# Patient Record
Sex: Female | Born: 1940 | Race: White | Hispanic: No | Smoking: Never smoker
Health system: Southern US, Community
[De-identification: ages and names within clinical notes are randomized; demographics above are authoritative.]

## PROBLEM LIST (undated history)

## (undated) DIAGNOSIS — G4730 Sleep apnea, unspecified: Secondary | ICD-10-CM

## (undated) DIAGNOSIS — E232 Diabetes insipidus: Secondary | ICD-10-CM

## (undated) DIAGNOSIS — I639 Cerebral infarction, unspecified: Secondary | ICD-10-CM

## (undated) DIAGNOSIS — J449 Chronic obstructive pulmonary disease, unspecified: Secondary | ICD-10-CM

## (undated) DIAGNOSIS — C50919 Malignant neoplasm of unspecified site of unspecified female breast: Secondary | ICD-10-CM

## (undated) DIAGNOSIS — E119 Type 2 diabetes mellitus without complications: Secondary | ICD-10-CM

## (undated) DIAGNOSIS — E7800 Pure hypercholesterolemia, unspecified: Secondary | ICD-10-CM

## (undated) DIAGNOSIS — J45909 Unspecified asthma, uncomplicated: Secondary | ICD-10-CM

## (undated) DIAGNOSIS — R011 Cardiac murmur, unspecified: Secondary | ICD-10-CM

## (undated) DIAGNOSIS — I10 Essential (primary) hypertension: Secondary | ICD-10-CM

## (undated) DIAGNOSIS — N289 Disorder of kidney and ureter, unspecified: Secondary | ICD-10-CM

## (undated) DIAGNOSIS — I219 Acute myocardial infarction, unspecified: Secondary | ICD-10-CM

## (undated) DIAGNOSIS — I509 Heart failure, unspecified: Secondary | ICD-10-CM

## (undated) DIAGNOSIS — K219 Gastro-esophageal reflux disease without esophagitis: Secondary | ICD-10-CM

## (undated) DIAGNOSIS — E041 Nontoxic single thyroid nodule: Secondary | ICD-10-CM

## (undated) HISTORY — PX: CAROTID STENT: SHX1301

## (undated) HISTORY — DX: Cerebral infarction, unspecified: I63.9

## (undated) HISTORY — DX: Gastro-esophageal reflux disease without esophagitis: K21.9

## (undated) HISTORY — PX: CHOLECYSTECTOMY: SHX55

## (undated) HISTORY — DX: Cardiac murmur, unspecified: R01.1

## (undated) HISTORY — DX: Sleep apnea, unspecified: G47.30

## (undated) HISTORY — DX: Chronic obstructive pulmonary disease, unspecified: J44.9

## (undated) HISTORY — DX: Pure hypercholesterolemia, unspecified: E78.00

## (undated) HISTORY — PX: TONSILLECTOMY AND ADENOIDECTOMY: SHX28

## (undated) HISTORY — DX: Acute myocardial infarction, unspecified: I21.9

## (undated) HISTORY — DX: Unspecified asthma, uncomplicated: J45.909

## (undated) HISTORY — DX: Disorder of kidney and ureter, unspecified: N28.9

## (undated) HISTORY — DX: Nontoxic single thyroid nodule: E04.1

## (undated) HISTORY — DX: Diabetes insipidus: E23.2

## (undated) HISTORY — DX: Essential (primary) hypertension: I10

## (undated) HISTORY — PX: TOTAL ABDOMINAL HYSTERECTOMY: SHX209

## (undated) HISTORY — DX: Heart failure, unspecified: I50.9

## (undated) HISTORY — DX: Malignant neoplasm of unspecified site of unspecified female breast: C50.919

## (undated) HISTORY — DX: Type 2 diabetes mellitus without complications: E11.9

## (undated) HISTORY — PX: APPENDECTOMY: SHX54

---

## 1996-05-07 HISTORY — PX: CORONARY ANGIOPLASTY: SHX604

## 1996-05-07 HISTORY — PX: NISSEN FUNDOPLICATION: SHX2091

## 1998-05-07 DIAGNOSIS — C50919 Malignant neoplasm of unspecified site of unspecified female breast: Secondary | ICD-10-CM

## 1998-05-07 HISTORY — DX: Malignant neoplasm of unspecified site of unspecified female breast: C50.919

## 1999-05-08 HISTORY — PX: MASTECTOMY: SHX3

## 1999-05-08 HISTORY — PX: BREAST SURGERY: SHX581

## 2000-05-07 HISTORY — PX: PARATHYROIDECTOMY: SHX19

## 2004-04-26 ENCOUNTER — Ambulatory Visit: Payer: Self-pay | Admitting: Oncology

## 2004-12-21 ENCOUNTER — Ambulatory Visit: Payer: Self-pay | Admitting: Oncology

## 2005-01-01 ENCOUNTER — Ambulatory Visit: Payer: Self-pay | Admitting: General Surgery

## 2005-05-31 ENCOUNTER — Ambulatory Visit: Payer: Self-pay | Admitting: Oncology

## 2005-11-05 ENCOUNTER — Ambulatory Visit: Payer: Self-pay | Admitting: Internal Medicine

## 2006-01-08 ENCOUNTER — Ambulatory Visit: Payer: Self-pay | Admitting: Cardiology

## 2006-01-09 ENCOUNTER — Ambulatory Visit: Payer: Self-pay | Admitting: General Surgery

## 2006-02-05 ENCOUNTER — Ambulatory Visit: Payer: Self-pay | Admitting: Ophthalmology

## 2006-02-11 ENCOUNTER — Other Ambulatory Visit: Payer: Self-pay

## 2006-02-11 ENCOUNTER — Emergency Department: Payer: Self-pay | Admitting: Emergency Medicine

## 2006-03-21 ENCOUNTER — Ambulatory Visit: Payer: Self-pay | Admitting: Oncology

## 2006-04-06 ENCOUNTER — Ambulatory Visit: Payer: Self-pay | Admitting: Oncology

## 2006-05-09 ENCOUNTER — Ambulatory Visit: Payer: Self-pay | Admitting: Ophthalmology

## 2006-05-13 ENCOUNTER — Ambulatory Visit: Payer: Self-pay | Admitting: Ophthalmology

## 2006-06-03 ENCOUNTER — Encounter: Payer: Self-pay | Admitting: Family Medicine

## 2006-06-07 ENCOUNTER — Encounter: Payer: Self-pay | Admitting: Family Medicine

## 2006-07-06 ENCOUNTER — Encounter: Payer: Self-pay | Admitting: Family Medicine

## 2006-08-19 ENCOUNTER — Ambulatory Visit: Payer: Self-pay | Admitting: Internal Medicine

## 2006-08-27 ENCOUNTER — Ambulatory Visit: Payer: Self-pay | Admitting: Internal Medicine

## 2006-10-21 ENCOUNTER — Ambulatory Visit: Payer: Self-pay | Admitting: Oncology

## 2006-11-01 ENCOUNTER — Ambulatory Visit: Payer: Self-pay | Admitting: Oncology

## 2006-11-04 ENCOUNTER — Ambulatory Visit: Payer: Self-pay | Admitting: Oncology

## 2006-11-05 ENCOUNTER — Ambulatory Visit: Payer: Self-pay | Admitting: Oncology

## 2006-12-22 IMAGING — CT CT HEAD WITHOUT CONTRAST
2 series · 16 of 30 positions shown, 20 images · non-contrast
Comparison: none

REASON FOR EXAM: facial numbness//Rm2
COMMENTS:

[Series 2: without · axial · non-contrast · 0.37mm/px · z∈[-94,+30]mm · 13 of 31 slices shown, 17 images]
[im 3/31  brain]
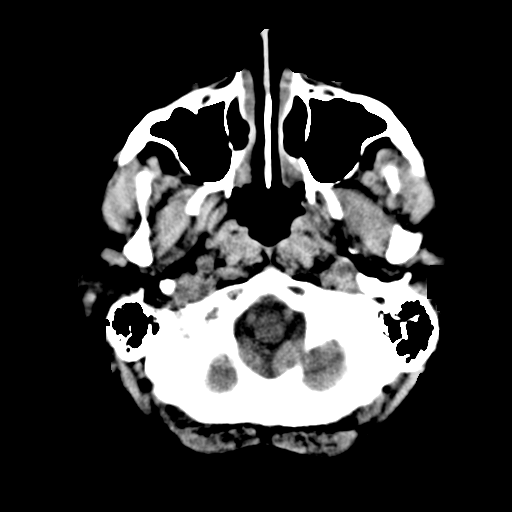
[im 3/31  bone]
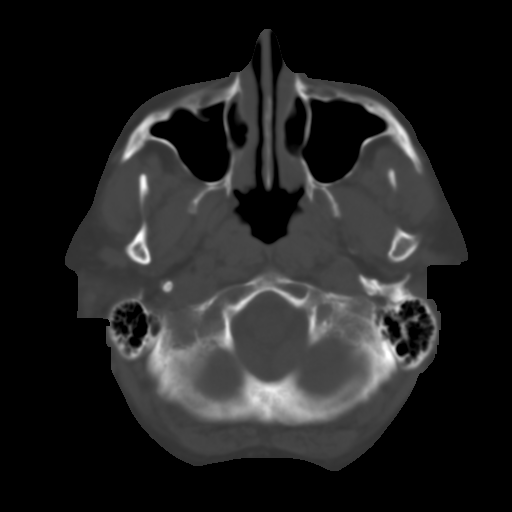
[im 5/31  brain]
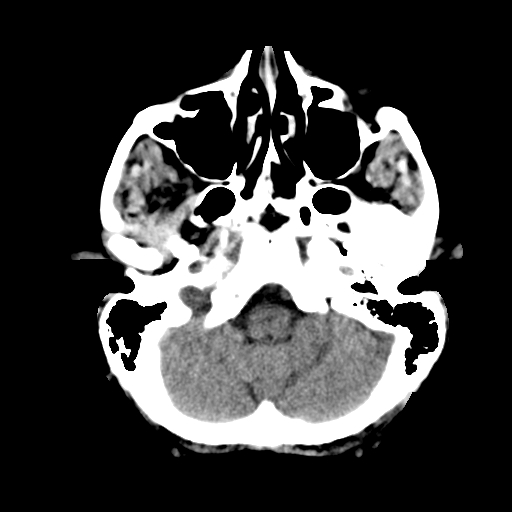
[im 7/31  brain]
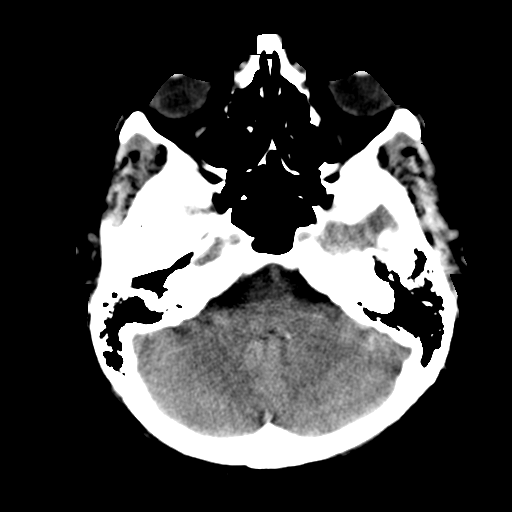
[im 9/31  brain]
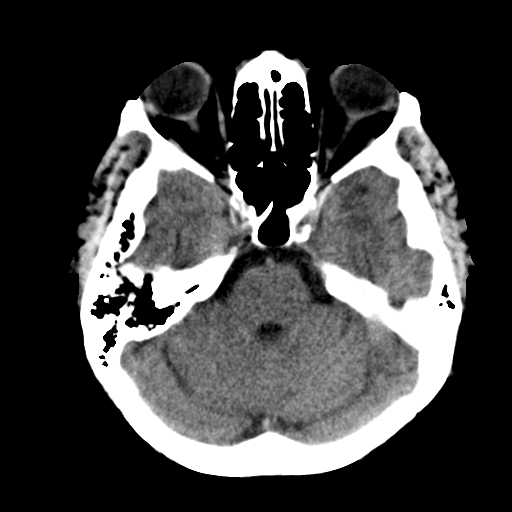
[im 11/31  brain]
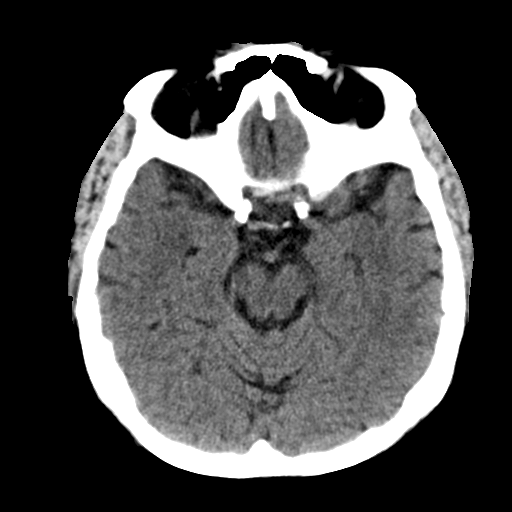
[im 11/31  bone]
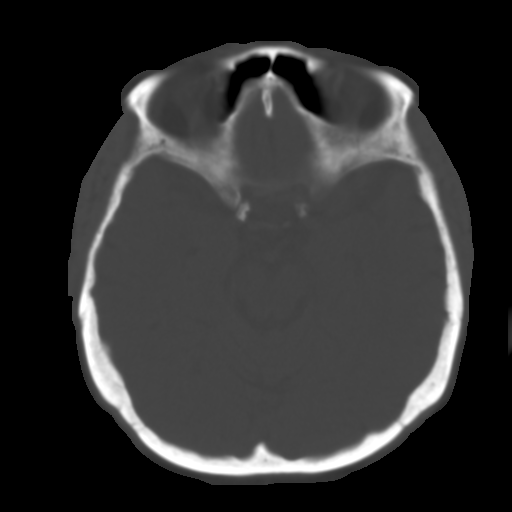
[im 13/31  brain]
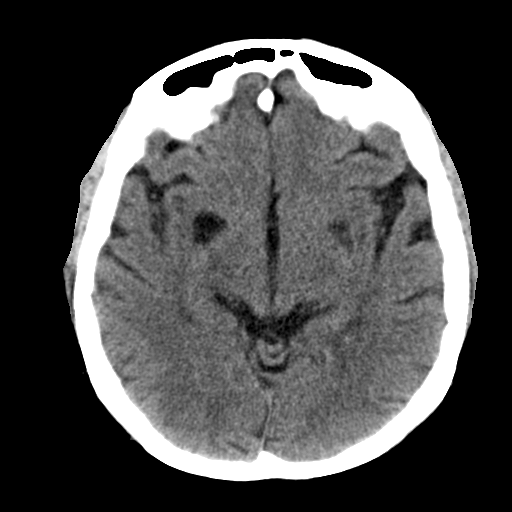
[im 16/31  brain]
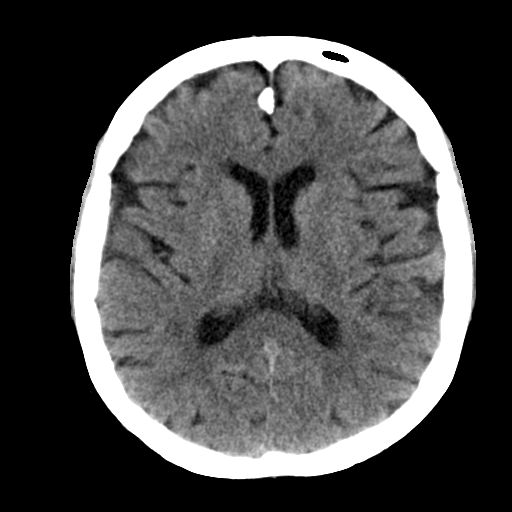
[im 18/31  brain]
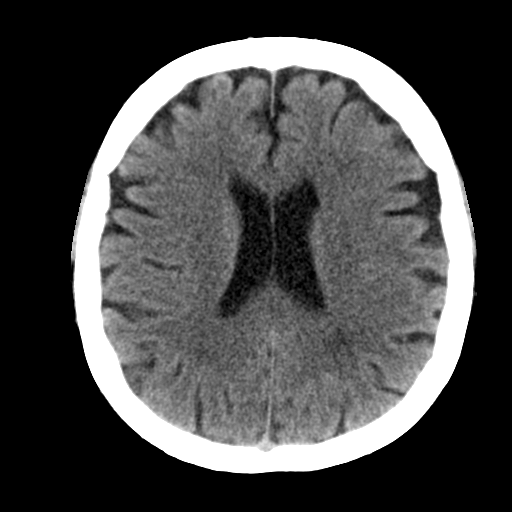
[im 20/31  brain]
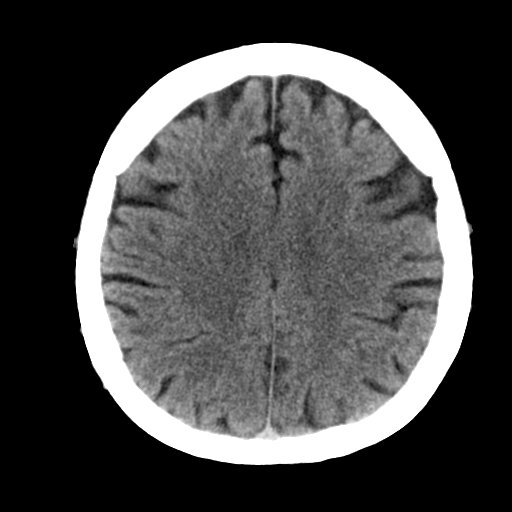
[im 20/31  bone]
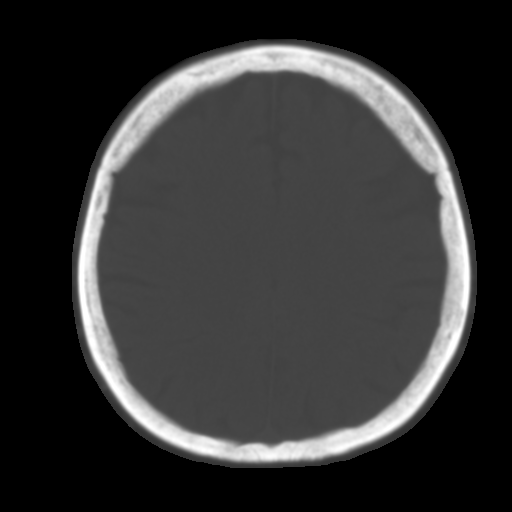
[im 22/31  brain]
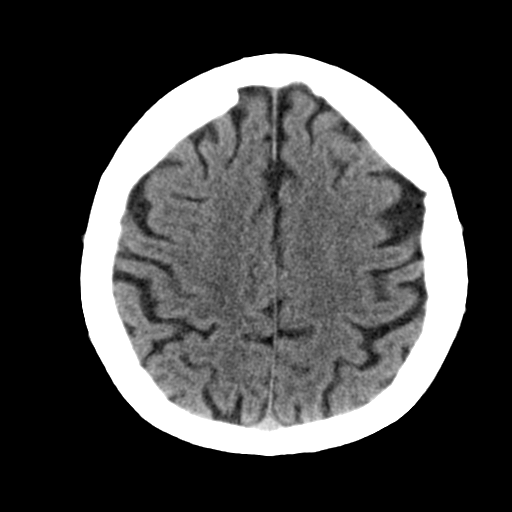
[im 24/31  brain]
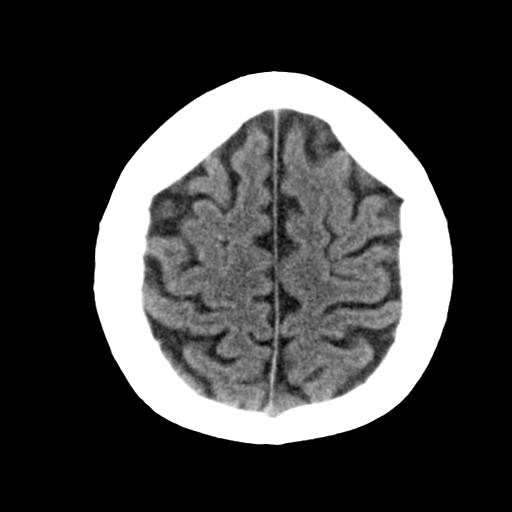
[im 26/31  brain]
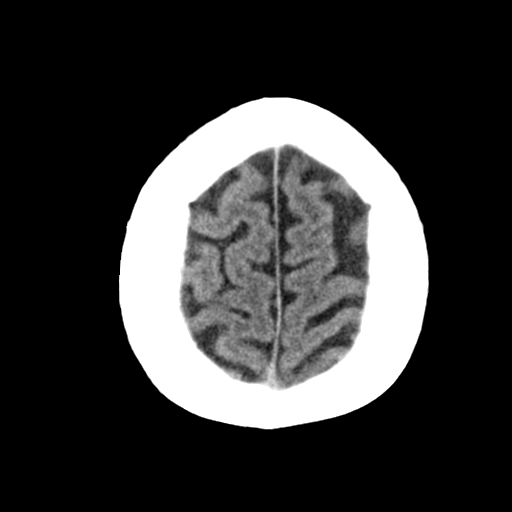
[im 28/31  brain]
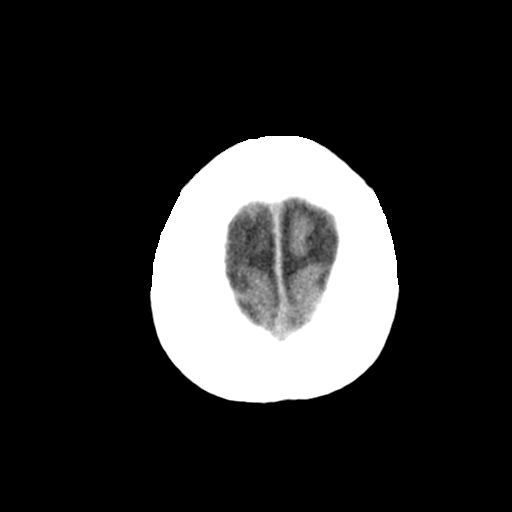
[im 28/31  bone]
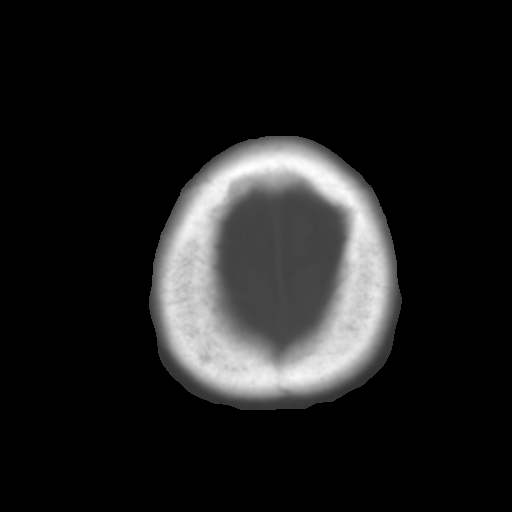

[Series 3: bone · axial · 0.37mm/px · z∈[-94,-54]mm · 3 of 31 slices shown]
[im 3/31  bone]
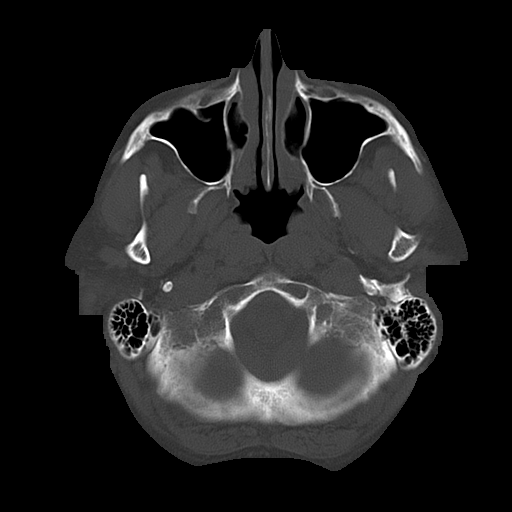
[im 7/31  bone]
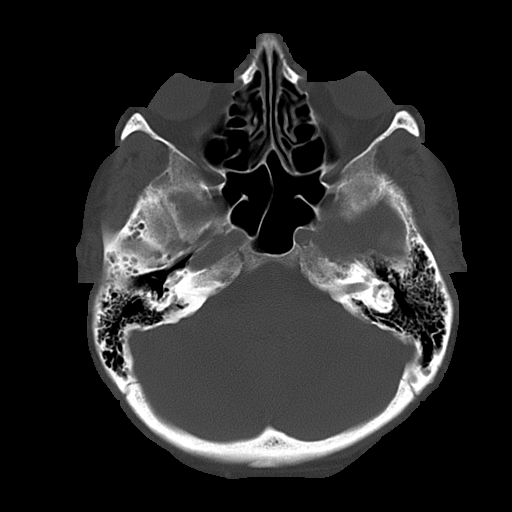
[im 11/31  bone]
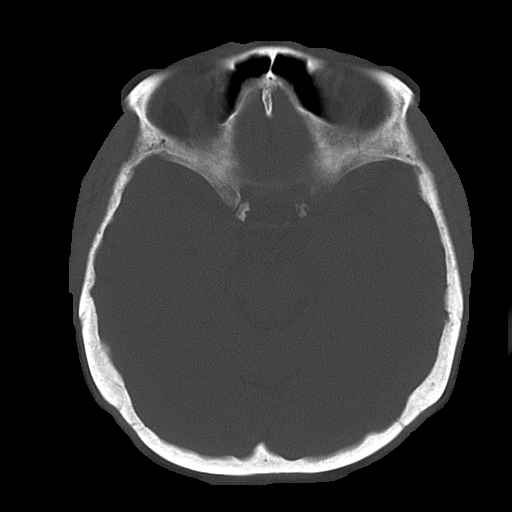

[16 of 30 positions shown; findings below may reference images not displayed]

PROCEDURE:     CT  - CT HEAD WITHOUT CONTRAST  - February 11, 2006  [DATE]

RESULT:     No intraaxial or extraaxial pathologic fluid or blood
collections identified.  No mass lesions are noted.  There is no
hydrocephalus.  Old lenticular nuclei lacunar infarctions are noted.
Paranasal sinuses are clear.
IMPRESSION: Bilateral lenticular nuclei lacunar infarctions noted.

No acute intracranial abnormalities identified.

Deep white matter changes consistent with chronic ischemia.

This report was phoned to the emergency room physician at the time of the
study.

## 2007-01-06 ENCOUNTER — Ambulatory Visit: Payer: Self-pay | Admitting: Oncology

## 2007-01-08 ENCOUNTER — Ambulatory Visit: Payer: Self-pay | Admitting: Oncology

## 2007-01-29 ENCOUNTER — Ambulatory Visit: Payer: Self-pay | Admitting: General Surgery

## 2007-02-05 ENCOUNTER — Ambulatory Visit: Payer: Self-pay | Admitting: Oncology

## 2007-04-07 ENCOUNTER — Ambulatory Visit: Payer: Self-pay | Admitting: Oncology

## 2007-04-11 ENCOUNTER — Ambulatory Visit: Payer: Self-pay | Admitting: Oncology

## 2007-05-08 ENCOUNTER — Ambulatory Visit: Payer: Self-pay | Admitting: Oncology

## 2007-06-03 ENCOUNTER — Ambulatory Visit: Payer: Self-pay | Admitting: Ophthalmology

## 2007-07-06 ENCOUNTER — Ambulatory Visit: Payer: Self-pay | Admitting: Oncology

## 2007-07-24 ENCOUNTER — Ambulatory Visit: Payer: Self-pay | Admitting: Oncology

## 2007-08-06 ENCOUNTER — Ambulatory Visit: Payer: Self-pay | Admitting: Oncology

## 2007-08-07 ENCOUNTER — Ambulatory Visit: Payer: Self-pay | Admitting: Oncology

## 2008-01-06 ENCOUNTER — Ambulatory Visit: Payer: Self-pay | Admitting: Oncology

## 2008-02-05 ENCOUNTER — Ambulatory Visit: Payer: Self-pay | Admitting: Oncology

## 2008-02-09 ENCOUNTER — Ambulatory Visit: Payer: Self-pay | Admitting: General Surgery

## 2008-03-07 ENCOUNTER — Ambulatory Visit: Payer: Self-pay | Admitting: Oncology

## 2008-03-11 ENCOUNTER — Ambulatory Visit: Payer: Self-pay | Admitting: Oncology

## 2008-04-06 ENCOUNTER — Ambulatory Visit: Payer: Self-pay | Admitting: Oncology

## 2009-01-05 ENCOUNTER — Ambulatory Visit: Payer: Self-pay | Admitting: Internal Medicine

## 2009-01-05 ENCOUNTER — Ambulatory Visit: Payer: Self-pay | Admitting: Oncology

## 2009-01-06 ENCOUNTER — Ambulatory Visit: Payer: Self-pay | Admitting: Oncology

## 2009-02-04 ENCOUNTER — Ambulatory Visit: Payer: Self-pay | Admitting: Oncology

## 2009-02-04 ENCOUNTER — Ambulatory Visit: Payer: Self-pay | Admitting: Internal Medicine

## 2009-03-30 ENCOUNTER — Ambulatory Visit: Payer: Self-pay | Admitting: General Surgery

## 2009-04-12 ENCOUNTER — Ambulatory Visit: Payer: Self-pay | Admitting: Ophthalmology

## 2009-04-18 ENCOUNTER — Ambulatory Visit: Payer: Self-pay | Admitting: Ophthalmology

## 2009-07-05 ENCOUNTER — Ambulatory Visit: Payer: Self-pay | Admitting: Oncology

## 2009-07-07 ENCOUNTER — Ambulatory Visit: Payer: Self-pay | Admitting: Oncology

## 2009-08-05 ENCOUNTER — Ambulatory Visit: Payer: Self-pay | Admitting: Oncology

## 2009-08-29 ENCOUNTER — Ambulatory Visit: Payer: Self-pay

## 2009-08-30 ENCOUNTER — Inpatient Hospital Stay: Payer: Self-pay | Admitting: Internal Medicine

## 2009-10-15 ENCOUNTER — Ambulatory Visit: Payer: Self-pay | Admitting: Internal Medicine

## 2009-12-14 ENCOUNTER — Ambulatory Visit: Payer: Self-pay | Admitting: Internal Medicine

## 2010-02-04 ENCOUNTER — Ambulatory Visit: Payer: Self-pay | Admitting: Oncology

## 2010-02-10 ENCOUNTER — Ambulatory Visit: Payer: Self-pay | Admitting: Oncology

## 2010-02-11 LAB — CANCER ANTIGEN 27.29: CA 27.29: 10.6 U/mL (ref 0.0–38.6)

## 2010-02-20 ENCOUNTER — Ambulatory Visit: Payer: Self-pay | Admitting: Specialist

## 2010-02-28 ENCOUNTER — Ambulatory Visit: Payer: Self-pay | Admitting: Specialist

## 2010-03-07 ENCOUNTER — Ambulatory Visit: Payer: Self-pay | Admitting: Oncology

## 2010-03-27 ENCOUNTER — Ambulatory Visit: Payer: Self-pay | Admitting: Unknown Physician Specialty

## 2010-03-29 LAB — PATHOLOGY REPORT

## 2010-04-11 ENCOUNTER — Ambulatory Visit: Payer: Self-pay | Admitting: Oncology

## 2010-05-13 ENCOUNTER — Emergency Department: Payer: Self-pay | Admitting: Emergency Medicine

## 2010-05-31 ENCOUNTER — Ambulatory Visit: Payer: Self-pay | Admitting: Oncology

## 2010-09-12 ENCOUNTER — Ambulatory Visit: Payer: Self-pay | Admitting: Oncology

## 2010-10-06 ENCOUNTER — Ambulatory Visit: Payer: Self-pay | Admitting: Oncology

## 2010-12-12 ENCOUNTER — Encounter: Payer: Self-pay | Admitting: Pulmonary Disease

## 2010-12-12 ENCOUNTER — Ambulatory Visit (INDEPENDENT_AMBULATORY_CARE_PROVIDER_SITE_OTHER): Payer: Medicare Other | Admitting: Pulmonary Disease

## 2010-12-12 VITALS — BP 140/70 | HR 74 | Temp 98.6°F | Ht 62.0 in | Wt 154.2 lb

## 2010-12-12 DIAGNOSIS — R05 Cough: Secondary | ICD-10-CM

## 2010-12-12 MED ORDER — CHLORPHENIRAMINE MALEATE 4 MG PO TABS: 4.0000 mg | ORAL_TABLET | Freq: Two times a day (BID) | ORAL | Status: AC

## 2010-12-12 MED ORDER — PSEUDOEPHEDRINE HCL ER 120 MG PO TB12: 120.0000 mg | ORAL_TABLET | Freq: Every day | ORAL | Status: AC

## 2010-12-12 NOTE — Progress Notes (Signed)
  Subjective:    Patient ID: Tanya Montgomery, female    DOB: 07/28/1940, 70 y.o.   MRN: [id number]  HPI 70/F , never smoker for evaluation of chronic cough. She reports Cough x 40y, productive She has been seen by Dr Meredeth Ide & Dr Williemae Area at [facility] - unchanged nodules Cough meds do not touch it - tussionex stops for some time Worse with laughter, morning is the  worst time Had reflux surgery > 10 y, takes 2 nexium daily, c/o breakthrough Reports sinus drainage No seasonal variations for cough, no wheezing or nocturnal symptoms Med review does not show any triggers.    Review of Systems  Constitutional: Positive for unexpected weight change. Negative for fever.  HENT: Positive for dental problem. Negative for ear pain, nosebleeds, congestion, sore throat, rhinorrhea, sneezing, trouble swallowing, postnasal drip and sinus pressure.   Eyes: Negative for redness and itching.  Respiratory: Positive for cough. Negative for chest tightness, shortness of breath and wheezing.   Cardiovascular: Negative for palpitations and leg swelling.  Gastrointestinal: Negative for nausea and vomiting.  Genitourinary: Negative for dysuria.  Musculoskeletal: Negative for joint swelling.  Skin: Negative for rash.  Neurological: Positive for headaches.  Hematological: Does not bruise/bleed easily.  Psychiatric/Behavioral: Negative for dysphoric mood. The patient is not nervous/anxious.        Objective:   Physical Exam Gen. Pleasant, well-nourished, in no distress, normal affect ENT - no lesions, no post nasal drip Neck: No JVD, no thyromegaly, no carotid bruits Lungs: no use of accessory muscles, no dullness to percussion, clear without rales or rhonchi  Cardiovascular: Rhythm regular, heart sounds  normal, no murmurs or gallops, no peripheral edema Abdomen: soft and non-tender, no hepatosplenomegaly, BS normal. Musculoskeletal: No deformities, no cyanosis or clubbing Neuro:  alert, non focal       Assessment & Plan:

## 2010-12-12 NOTE — Patient Instructions (Signed)
Trial of anti-histaminic (sleepiness) + decongestant (check BP 2-3 times/ wk) - OK to take these over the counter at pharmacy Obtain records from Dr Meredeth Ide [facility] & Dr Williemae Area [facility]) with CT scan results Call if flare

## 2010-12-15 DIAGNOSIS — R05 Cough: Secondary | ICD-10-CM | POA: Insufficient documentation

## 2010-12-15 DIAGNOSIS — R053 Chronic cough: Secondary | ICD-10-CM | POA: Insufficient documentation

## 2010-12-15 NOTE — Assessment & Plan Note (Signed)
Favor combination of upper airway cough & GERD Trial of anti-histaminic (sleepiness) + decongestant (check BP 2-3 times/ wk) - OK to take these over the counter at pharmacy Obtain records from Dr Meredeth Ide [facility] & Dr Williemae Area [facility]) with CT scan results, rather than re-inventing the wheel. Call if flare

## 2011-01-02 ENCOUNTER — Other Ambulatory Visit: Payer: Self-pay | Admitting: Unknown Physician Specialty

## 2011-03-28 ENCOUNTER — Ambulatory Visit: Payer: Self-pay | Admitting: Oncology

## 2011-03-30 LAB — CANCER ANTIGEN 27.29: CA 27.29: 15.4 U/mL (ref 0.0–38.6)

## 2011-04-07 ENCOUNTER — Ambulatory Visit: Payer: Self-pay | Admitting: Oncology

## 2011-05-08 ENCOUNTER — Ambulatory Visit: Payer: Self-pay | Admitting: Oncology

## 2011-06-08 ENCOUNTER — Ambulatory Visit: Payer: Self-pay | Admitting: Oncology

## 2011-08-24 ENCOUNTER — Inpatient Hospital Stay: Payer: Self-pay | Admitting: Internal Medicine

## 2011-08-24 LAB — COMPREHENSIVE METABOLIC PANEL
Albumin: 3.9 g/dL (ref 3.4–5.0)
Alkaline Phosphatase: 97 U/L (ref 50–136)
BUN: 20 mg/dL — ABNORMAL HIGH (ref 7–18)
Bilirubin,Total: 0.4 mg/dL (ref 0.2–1.0)
Calcium, Total: 9 mg/dL (ref 8.5–10.1)
Co2: 25 mmol/L (ref 21–32)
Creatinine: 0.98 mg/dL (ref 0.60–1.30)
EGFR (African American): >60
EGFR (Non-African Amer.): 58 — ABNORMAL LOW
Glucose: 103 mg/dL — ABNORMAL HIGH (ref 65–99)
Osmolality: 279 (ref 275–301)
Sodium: 138 mmol/L (ref 136–145)
Total Protein: 7.3 g/dL (ref 6.4–8.2)

## 2011-08-24 LAB — CBC
HGB: 13.4 g/dL (ref 12.0–16.0)
Platelet: 219 10*3/uL (ref 150–440)
RDW: 14.2 % (ref 11.5–14.5)
WBC: 7.6 10*3/uL (ref 3.6–11.0)

## 2011-08-24 LAB — CK TOTAL AND CKMB (NOT AT ARMC)
CK, Total: 96 U/L (ref 21–215)
CK-MB: 2.2 ng/mL (ref 0.5–3.6)

## 2011-08-24 LAB — PROTIME-INR: INR: 0.9

## 2011-08-24 LAB — TROPONIN I: Troponin-I: 0.03 ng/mL

## 2011-08-25 LAB — BASIC METABOLIC PANEL
Anion Gap: 10 (ref 7–16)
Calcium, Total: 8.3 mg/dL — ABNORMAL LOW (ref 8.5–10.1)
Chloride: 99 mmol/L (ref 98–107)
Co2: 29 mmol/L (ref 21–32)
Creatinine: 1.02 mg/dL (ref 0.60–1.30)
EGFR (Non-African Amer.): 55 — ABNORMAL LOW
Osmolality: 282 (ref 275–301)
Sodium: 138 mmol/L (ref 136–145)

## 2011-08-25 LAB — CBC WITH DIFFERENTIAL/PLATELET
Basophil %: 0.7 %
Eosinophil #: 0.1 10*3/uL (ref 0.0–0.7)
HCT: 34.7 % — ABNORMAL LOW (ref 35.0–47.0)
HGB: 11.8 g/dL — ABNORMAL LOW (ref 12.0–16.0)
Lymphocyte #: 2.7 10*3/uL (ref 1.0–3.6)
MCH: 32.1 pg (ref 26.0–34.0)
MCHC: 33.9 g/dL (ref 32.0–36.0)
MCV: 95 fL (ref 80–100)
Monocyte #: 0.6 x10 3/mm (ref 0.2–0.9)
Monocyte %: 8.1 %
Neutrophil #: 3.5 10*3/uL (ref 1.4–6.5)
Platelet: 199 10*3/uL (ref 150–440)
RBC: 3.66 10*6/uL — ABNORMAL LOW (ref 3.80–5.20)

## 2011-08-25 LAB — LIPID PANEL
Cholesterol: 184 mg/dL (ref 0–200)
HDL Cholesterol: 31 mg/dL — ABNORMAL LOW (ref 40–60)

## 2011-08-25 LAB — CK TOTAL AND CKMB (NOT AT ARMC)
CK, Total: 80 U/L (ref 21–215)
CK, Total: 87 U/L (ref 21–215)
CK-MB: 2.4 ng/mL (ref 0.5–3.6)

## 2011-08-25 LAB — TROPONIN I: Troponin-I: 0.03 ng/mL

## 2011-08-26 LAB — HEMATOCRIT: HCT: 36.5 % (ref 35.0–47.0)

## 2011-08-27 LAB — CBC WITH DIFFERENTIAL/PLATELET
Basophil #: 0 10*3/uL (ref 0.0–0.1)
Basophil %: 0.9 %
Eosinophil %: 1.5 %
HCT: 36.2 % (ref 35.0–47.0)
HGB: 12.3 g/dL (ref 12.0–16.0)
Lymphocyte #: 2.2 10*3/uL (ref 1.0–3.6)
MCHC: 33.9 g/dL (ref 32.0–36.0)
Monocyte #: 0.5 x10 3/mm (ref 0.2–0.9)
Monocyte %: 10.9 %
Neutrophil #: 1.8 10*3/uL (ref 1.4–6.5)
Neutrophil %: 39.2 %
Platelet: 161 10*3/uL (ref 150–440)
RBC: 3.82 10*6/uL (ref 3.80–5.20)
RDW: 14.1 % (ref 11.5–14.5)
WBC: 4.7 10*3/uL (ref 3.6–11.0)

## 2011-08-27 LAB — BASIC METABOLIC PANEL
BUN: 15 mg/dL (ref 7–18)
Calcium, Total: 8.5 mg/dL (ref 8.5–10.1)
Co2: 30 mmol/L (ref 21–32)
Creatinine: 0.74 mg/dL (ref 0.60–1.30)
EGFR (African American): >60
EGFR (Non-African Amer.): >60
Osmolality: 278 (ref 275–301)
Sodium: 138 mmol/L (ref 136–145)

## 2012-01-19 ENCOUNTER — Emergency Department: Payer: Self-pay | Admitting: Emergency Medicine

## 2012-01-19 LAB — COMPREHENSIVE METABOLIC PANEL
Albumin: 3.4 g/dL (ref 3.4–5.0)
Anion Gap: 10 (ref 7–16)
BUN: 14 mg/dL (ref 7–18)
Calcium, Total: 8.9 mg/dL (ref 8.5–10.1)
Chloride: 100 mmol/L (ref 98–107)
Co2: 29 mmol/L (ref 21–32)
EGFR (African American): >60
EGFR (Non-African Amer.): >60
Glucose: 118 mg/dL — ABNORMAL HIGH (ref 65–99)
Potassium: 4 mmol/L (ref 3.5–5.1)
SGOT(AST): 23 U/L (ref 15–37)
SGPT (ALT): 29 U/L (ref 12–78)
Total Protein: 6.9 g/dL (ref 6.4–8.2)

## 2012-01-19 LAB — URINALYSIS, COMPLETE
Blood: NEGATIVE
Ketone: NEGATIVE
Nitrite: NEGATIVE
Protein: NEGATIVE
RBC,UR: <1 /HPF (ref 0–5)
Specific Gravity: 1.004 (ref 1.003–1.030)
Squamous Epithelial: NONE SEEN
WBC UR: <1 /HPF (ref 0–5)

## 2012-01-19 LAB — CBC WITH DIFFERENTIAL/PLATELET
Basophil: 2 %
Comment - H1-Com2: NORMAL
Eosinophil: 4 %
Lymphocytes: 19 %
MCHC: 35 g/dL (ref 32.0–36.0)
Monocytes: 6 %
Platelet: 156 10*3/uL (ref 150–440)

## 2012-01-19 LAB — APTT: Activated PTT: 27.2 secs (ref 23.6–35.9)

## 2012-02-18 ENCOUNTER — Ambulatory Visit (INDEPENDENT_AMBULATORY_CARE_PROVIDER_SITE_OTHER): Payer: Medicare Other | Admitting: Internal Medicine

## 2012-02-18 ENCOUNTER — Encounter: Payer: Self-pay | Admitting: Internal Medicine

## 2012-02-18 ENCOUNTER — Ambulatory Visit: Payer: Medicare Other | Admitting: Internal Medicine

## 2012-02-18 VITALS — BP 120/70 | HR 80 | Temp 97.9°F | Ht 62.5 in | Wt 153.0 lb

## 2012-02-18 DIAGNOSIS — R053 Chronic cough: Secondary | ICD-10-CM

## 2012-02-18 DIAGNOSIS — R05 Cough: Secondary | ICD-10-CM

## 2012-02-18 DIAGNOSIS — J45909 Unspecified asthma, uncomplicated: Secondary | ICD-10-CM

## 2012-02-18 MED ORDER — FORMOTEROL FUMARATE 20 MCG/2ML IN NEBU: 20.0000 ug | INHALATION_SOLUTION | Freq: Two times a day (BID) | RESPIRATORY_TRACT | Status: DC

## 2012-02-18 MED ORDER — BUDESONIDE 0.25 MG/2ML IN SUSP: RESPIRATORY_TRACT | Status: DC

## 2012-02-18 NOTE — Patient Instructions (Addendum)
Performist and budesonide twice daily in nebulizer and ok to use duoneb only as needed  Nexium 40 mg Take 30-60 Take 30- 60 min before your first and last meals of the day   GERD (REFLUX)  is an extremely common cause of respiratory symptoms, many times with no significant heartburn at all.    It can be treated with medication, but also with lifestyle changes including avoidance of late meals, excessive alcohol, smoking cessation, and avoid fatty foods, chocolate, peppermint, colas, red wine, and acidic juices such as orange juice.  NO MINT OR MENTHOL PRODUCTS SO NO COUGH DROPS  USE SUGARLESS CANDY INSTEAD (jolley ranchers or Stover's)  NO OIL BASED VITAMINS - use powdered substitutes.    Please schedule a follow up office visit in 4 weeks, sooner if needed with pft's on return

## 2012-02-18 NOTE — Progress Notes (Signed)
Subjective:    Patient ID: Tanya Montgomery, female    DOB: 02-21-1941  MRN: [id number]  HPI  66 yowf with lifelong heavy smoke exposure with ear and throat  Infections as child then onset of cough ever since severe flu in the 1960's referred 02/18/2012 to [facility] Pulmonary clinic by Dr Yates Decamp.  02/18/2012 f/u pulmonary eval first visit to [facility] clinic / Arbor Leer cc daily "cough fit" x 50 years no pattern, min prod of thick ropy mucoid sputum but does occur pretty much every am and coughs so hard loses breath and bad urinary incont, better with nexium and nasal spray and best with nebulizer up to 3x daily but never wakes up and needs this while sleeping. Only sob when coughing. Wallked all around [facility] 2013 without a problem.  No obvious daytime variabilty or assoccp or chest tightness, subjective wheeze overt sinus or hb symptoms. No unusual exp hx or h/o childhood pna/ asthma or premature birth to her knowledge.  Takes nexium at hs  Sleeping ok without nocturnal  or early am exacerbation  of respiratory  c/o's or need for noct saba. Also denies any obvious fluctuation of symptoms with weather or environmental changes or other aggravating or alleviating factors except as outlined above     Kouffman Reflux v Neurogenic Cough Differentiator Reflux Comments  Do you awaken from a sound sleep coughing violently?                            With trouble breathing? no   Do you have choking episodes when you cannot  Get enough air, gasping for air ?              Yes   Do you usually cough when you lie down into  The bed, or when you just lie down to rest ?                          Yes   Do you usually cough after meals or eating?         no   Do you cough when (or after) you bend over?    Yes   GERD SCORE     Kouffman Reflux v Neurogenic Cough Differentiator Neurogenic   Do you more-or-less cough all day long? no   Does change of temperature make you cough? yes   Does laughing or chuckling  cause you to cough? yes   Do fumes (perfume, automobile fumes, burned  Toast, etc.,) cause you to cough ?      yes   Does speaking, singing, or talking on the phone cause you to cough   ?               yes   Neurogenic/Airway score       Review of Systems  Constitutional: Negative for fever, chills and unexpected weight change.  HENT: Negative for ear pain, nosebleeds, congestion, sore throat, rhinorrhea, sneezing, trouble swallowing, dental problem, voice change, postnasal drip and sinus pressure.   Eyes: Negative for visual disturbance.  Respiratory: Positive for cough and shortness of breath. Negative for choking.   Cardiovascular: Positive for chest pain and leg swelling.  Gastrointestinal: Negative for vomiting, abdominal pain and diarrhea.  Genitourinary: Negative for difficulty urinating.  Musculoskeletal: Negative for arthralgias.  Skin: Negative for rash.  Neurological: Negative for tremors, syncope and headaches.  Hematological: Does not bruise/bleed easily.  Objective:   Physical Exam  Very pleasant amb wf nad  Wt  153 02/18/2012  HEENT: nl dentition, turbinates, and orophanx. Nl external ear canals without cough reflex   NECK :  without JVD/Nodes/TM/ nl carotid upstrokes bilaterally   LUNGS: no acc muscle use, clear to A and P bilaterally with  Cough  Reproducible on exp maneuvers   CV:  RRR  no s3 or murmur or increase in P2, no edema   ABD:  soft and nontender with nl excursion in the supine position. No bruits or organomegaly, bowel sounds nl  MS:  warm without deformities, calf tenderness, cyanosis or clubbing  SKIN: warm and dry without lesions    NEURO:  alert, approp, no deficits          Assessment & Plan:

## 2012-02-19 ENCOUNTER — Ambulatory Visit: Payer: Medicare Other | Admitting: Internal Medicine

## 2012-02-19 NOTE — Assessment & Plan Note (Signed)
The most common causes of chronic cough in immunocompetent adults include the following: upper airway cough syndrome (UACS), previously referred to as postnasal drip syndrome (PNDS), which is caused by variety of rhinosinus conditions; (2) asthma; (3) GERD; (4) chronic bronchitis from cigarette smoking or other inhaled environmental irritants; (5) nonasthmatic eosinophilic bronchitis; and (6) bronchiectasis.   These conditions, singly or in combination, have accounted for up to 94% of the causes of chronic cough in prospective studies.   Other conditions have constituted no >6% of the causes in prospective studies These have included bronchogenic carcinoma, chronic interstitial pneumonia, sarcoidosis, left ventricular failure, ACEI-induced cough, and aspiration from a condition associated with pharyngeal dysfunction.  After 50 years there are probable multiple "causes" for the cough but Of the three most common causes of chronic cough, only one (GERD)  can actually cause the other two (asthma and post nasal drip syndrome)  and perpetuate the cylce of cough inducing airway trauma, inflammation, heightened sensitivity to reflux which is prompted by the cough itself via a cyclical mechanism.    This may partially respond to steroids and look like asthma and post nasal drainage but never erradicated completely unless the cough and the secondary reflux are eliminated, preferably both at the same time.   For now rec a trial of max gerd ppi/ diet/ trial of formoterol and bud (treat her as asthma, not copd, for which which there is no evidence) and regroup in 4 weeks

## 2012-03-11 ENCOUNTER — Ambulatory Visit: Payer: Self-pay | Admitting: Internal Medicine

## 2012-03-11 LAB — PULMONARY FUNCTION TEST

## 2012-03-19 ENCOUNTER — Ambulatory Visit (INDEPENDENT_AMBULATORY_CARE_PROVIDER_SITE_OTHER): Payer: Medicare Other | Admitting: Internal Medicine

## 2012-03-19 ENCOUNTER — Encounter: Payer: Self-pay | Admitting: Internal Medicine

## 2012-03-19 VITALS — BP 126/72 | HR 72 | Temp 97.7°F | Ht 62.0 in | Wt 145.8 lb

## 2012-03-19 DIAGNOSIS — R05 Cough: Secondary | ICD-10-CM

## 2012-03-19 DIAGNOSIS — I10 Essential (primary) hypertension: Secondary | ICD-10-CM

## 2012-03-19 MED ORDER — NEBIVOLOL HCL 10 MG PO TABS: 10.0000 mg | ORAL_TABLET | Freq: Every day | ORAL | Status: DC

## 2012-03-19 MED ORDER — BUDESONIDE 0.25 MG/2ML IN SUSP: RESPIRATORY_TRACT | Status: DC

## 2012-03-19 NOTE — Patient Instructions (Addendum)
Stop amlodipine and lopressor after today Tomorrow start bystolic 10 mg one daily Add budesonide to a daily morning dose of duoneb and hold the budesonide for now  Please schedule a follow up office visit in 4 weeks, sooner if needed

## 2012-03-19 NOTE — Progress Notes (Signed)
Subjective:    Patient ID: Tanya Montgomery, female    DOB: Oct 02, 1940  MRN: [id number]  HPI  53 yowf with lifelong heavy smoke exposure with ear and throat  Infections as child then onset of cough ever since severe flu in the 1960's referred 02/18/2012 to [facility] Pulmonary clinic by Dr Yates Decamp with completely nl pfts 03/11/12   02/18/2012 f/u pulmonary eval first visit to [facility] clinic / Juaquina Machnik cc daily "cough fit" x 50 years no pattern, min prod of thick ropy mucoid sputum but does occur pretty much every am and coughs so hard loses breath and bad urinary incont, better with nexium and nasal spray and best with nebulizer up to 3x daily but never wakes up and needs this while sleeping. Only sob when coughing. Wallked all around [facility] 2013 without a problem.    Takes nexium at hs rec Performist and budesonide twice daily in nebulizer and ok to use duoneb only as needed Nexium 40 mg Take 30-60 Take 30- 60 min before your first and last meals of the day  GERD diet   03/19/2012 f/u ov/Shiva Sahagian no longer on performist/bud,  Using duoneb on avg every other day, when uses the neb it's for cough but only helps for a few hours,   On nexium 30 min before bfast and zantac after supper 150   Sleeping ok without nocturnal  or early am exacerbation  of respiratory  c/o's or need for noct saba. Also denies any obvious fluctuation of symptoms with weather or environmental changes or other aggravating or alleviating factors except as outlined above     Kouffman Reflux v Neurogenic Cough Differentiator Reflux Comments  Do you awaken from a sound sleep coughing violently?                            With trouble breathing? no   Do you have choking episodes when you cannot  Get enough air, gasping for air ?              less   Do you usually cough when you lie down into  The bed, or when you just lie down to rest ?                          less   Do you usually cough after meals or eating?         no     Do you cough when (or after) you bend over?    Yes   GERD SCORE     Kouffman Reflux v Neurogenic Cough Differentiator Neurogenic   Do you more-or-less cough all day long? no   Does change of temperature make you cough? yes   Does laughing or chuckling cause you to cough? yes   Do fumes (perfume, automobile fumes, burned  Toast, etc.,) cause you to cough ?      yes   Does speaking, singing, or talking on the phone cause you to cough   ?               yes   Neurogenic/Airway score      ROS  The following are not active complaints unless bolded sore throat, dysphagia, dental problems, itching, sneezing,  nasal congestion or excess/ purulent secretions, ear ache,   fever, chills, sweats, unintended wt loss, pleuritic or exertional cp, hemoptysis,  orthopnea pnd or leg swelling, presyncope, palpitations, heartburn,  abdominal pain, anorexia, nausea, vomiting, diarrhea  or change in bowel or urinary habits, change in stools or urine, dysuria,hematuria,  rash, arthralgias, visual complaints, headache, numbness weakness or ataxia or problems with walking or coordination,  change in mood/affect or memory.           Objective:   Physical Exam  Very pleasant amb wf nad  Wt  153 02/18/2012 > 03/19/2012 145   HEENT: nl dentition, turbinates, and orophanx. Nl external ear canals without cough reflex   NECK :  without JVD/Nodes/TM/ nl carotid upstrokes bilaterally   LUNGS: no acc muscle use, clear to A and P bilaterally with  Cough somewhat Reproducible on exp maneuvers   CV:  RRR  no s3 or murmur or increase in P2, no edema   ABD:  soft and nontender with nl excursion in the supine position. No bruits or organomegaly, bowel sounds nl  MS:  warm without deformities, calf tenderness, cyanosis or clubbing  SKIN: warm and dry without lesions               Assessment & Plan:

## 2012-03-20 DIAGNOSIS — I10 Essential (primary) hypertension: Secondary | ICD-10-CM | POA: Insufficient documentation

## 2012-03-20 NOTE — Assessment & Plan Note (Signed)
-   PFT's 03/11/12 wnl  Clearly she doesn't have copd but likely has an asthmatic component and can't use hfa well with dpis a poor choice in asthmatics who predominantly cough.  I had an extended discussion with the patient today lasting 15 to 20 minutes of a 25 minute visit on the following issues:   Options for rx reviewed, not clear she was "allergic" to either perforomist or budesonide but would like her to rechallenge with bud one vial each am with her duoneb on trial basis and then regroup  The standardized cough guidelines recently published in Chest by [place] in 2006  are a multiple step process (up to 12!) , not a single office visit,  and are intended  to address this problem logically,  with an alogrithm dependent on response to empiric treatment at  each progressive step  to determine a specific diagnosis with  minimal addtional testing needed. Therefore if compliance is an issue or can't be accurately verified then it's very unlikely the standard evaluation and treatment will be successful here.    Furthermore, response to therapy (other than acute cough suppression, which should only be used short term with avoidance of narcotic containing cough syrups if possible), can be a gradual process for which the patient may not receive immediate benefit.  Unlike going to an eye doctor where the right rx is almost always the first one and is immediately effective, this is almost never the case in the management of chronic cough syndromes and the patient needs to commit up front to compliance with recommendations and have the patience to wait out a response for up to 6 weeks of therapy directed at the likely underlying problem(s).

## 2012-03-20 NOTE — Assessment & Plan Note (Signed)
Both amlodipine (due to effect on LES) and lopressor (due to B blocker effect) can be problematic is cough/ asthma syndromes so will replace with bystolic 10 mg daily on a trial basis and regroup in 4 weeks

## 2012-04-02 ENCOUNTER — Encounter: Payer: Self-pay | Admitting: Internal Medicine

## 2012-04-14 ENCOUNTER — Encounter: Payer: Self-pay | Admitting: Internal Medicine

## 2012-04-14 ENCOUNTER — Ambulatory Visit (INDEPENDENT_AMBULATORY_CARE_PROVIDER_SITE_OTHER): Payer: Medicare Other | Admitting: Internal Medicine

## 2012-04-14 VITALS — BP 120/80 | HR 74 | Temp 97.9°F | Ht 62.0 in | Wt 150.0 lb

## 2012-04-14 DIAGNOSIS — I10 Essential (primary) hypertension: Secondary | ICD-10-CM

## 2012-04-14 DIAGNOSIS — R059 Cough, unspecified: Secondary | ICD-10-CM

## 2012-04-14 DIAGNOSIS — R053 Chronic cough: Secondary | ICD-10-CM

## 2012-04-14 DIAGNOSIS — R05 Cough: Secondary | ICD-10-CM

## 2012-04-14 MED ORDER — NEBIVOLOL HCL 10 MG PO TABS: 10.0000 mg | ORAL_TABLET | Freq: Every day | ORAL | Status: DC

## 2012-04-14 NOTE — Progress Notes (Signed)
Subjective:    Patient ID: Tanya Montgomery, female    DOB: 06-25-1940  MRN: [id number]  HPI  25 yowf with lifelong heavy smoke exposure with ear and throat  Infections as child then onset of cough ever since severe flu in the 1960's referred 02/18/2012 to [facility] Pulmonary clinic by Dr Yates Decamp with completely nl pfts 03/11/12   02/18/2012 f/u pulmonary eval first visit to [facility] clinic / Yvonnia Tango cc daily "cough fit" x 50 years no pattern, min prod of thick ropy mucoid sputum but does occur pretty much every am and coughs so hard loses breath and bad urinary incont, better with nexium and nasal spray and best with nebulizer up to 3x daily but never wakes up and needs this while sleeping. Only sob when coughing. Wallked all around [facility] 2013 without a problem. On nexium at hs rec Performist and budesonide twice daily in nebulizer and ok to use duoneb only as needed Nexium 40 mg Take 30-60 Take 30- 60 min before your first and last meals of the day  GERD diet   03/19/2012 f/u ov/[facility] no longer on performist/bud,  Using duoneb on avg every other day, when uses the neb it's for cough but only helps for a few hours,   On nexium 30 min before bfast and zantac after supper 150  rec Stop amlodipine and lopressor after today Tomorrow start bystolic 10 mg one daily "Add budesonide to a daily morning dose of duoneb and hold the budesonide for now" > she says caught this typo and thinks she started performist / bud bid and stopped duoneb (though not sure) which didn't feel she needed and still still nexium/carafate plus zantac at hs  04/14/12 ov/Mahir Prabhakar cc cough varies, confused with meds/ names. No obvious daytime variabilty or assoc sob or cp or chest tightness, subjective wheeze overt sinus or hb symptoms. No unusual exp hx or h/o childhood pna/ asthma or premature birth to his knowledge.      Sleeping ok without nocturnal  or early am exacerbation  of respiratory  c/o's or need  for noct saba. Also denies any obvious fluctuation of symptoms with weather or environmental changes or other aggravating or alleviating factors except as outlined above     Kouffman Reflux v Neurogenic Cough Differentiator Reflux Comments  Do you awaken from a sound sleep coughing violently?                            With trouble breathing? no   Do you have choking episodes when you cannot  Get enough air, gasping for air ?              less   Do you usually cough when you lie down into  The bed, or when you just lie down to rest ?                          less   Do you usually cough after meals or eating?         no   Do you cough when (or after) you bend over?    Yes   GERD SCORE     Kouffman Reflux v Neurogenic Cough Differentiator Neurogenic   Do you more-or-less cough all day long? no   Does change of temperature make you cough? yes   Does laughing or chuckling cause you to cough? yes   Do  fumes (perfume, automobile fumes, burned  Toast, etc.,) cause you to cough ?      yes   Does speaking, singing, or talking on the phone cause you to cough   ?               yes   Neurogenic/Airway score      ROS  The following are not active complaints unless bolded sore throat, dysphagia, dental problems, itching, sneezing,  nasal congestion or excess/ purulent secretions, ear ache,   fever, chills, sweats, unintended wt loss, pleuritic or exertional cp, hemoptysis,  orthopnea pnd or leg swelling, presyncope, palpitations, heartburn, abdominal pain, anorexia, nausea, vomiting, diarrhea  or change in bowel or urinary habits, change in stools or urine, dysuria,hematuria,  rash, arthralgias, visual complaints, headache, numbness weakness or ataxia or problems with walking or coordination,  change in mood/affect or memory.           Objective:   Physical Exam  Very pleasant amb wf nad  Wt  153 02/18/2012 > 03/19/2012 145 > 04/14/2012 150   HEENT: nl dentition, turbinates, and orophanx. Nl  external ear canals without cough reflex   NECK :  without JVD/Nodes/TM/ nl carotid upstrokes bilaterally   LUNGS: no acc muscle use, clear to A and P bilaterally with  Cough somewhat Reproducible on exp maneuvers   CV:  RRR  no s3 or murmur or increase in P2, no edema   ABD:  soft and nontender with nl excursion in the supine position. No bruits or organomegaly, bowel sounds nl  MS:  warm without deformities, calf tenderness, cyanosis or clubbing  SKIN: warm and dry without lesions               Assessment & Plan:

## 2012-04-14 NOTE — Patient Instructions (Addendum)
We need to set for a sinus ct and obtain  the results of your last chest CT from [facility]  Stop sucralfate (carafate)  GERD (REFLUX)  is an extremely common cause of respiratory symptoms, many times with no significant heartburn at all.    It can be treated with medication, but also with lifestyle changes including avoidance of late meals, excessive alcohol, smoking cessation, and avoid fatty foods, chocolate, peppermint, colas, red wine, and acidic juices such as orange juice.  NO MINT OR MENTHOL PRODUCTS SO NO COUGH DROPS  USE SUGARLESS CANDY INSTEAD (jolley ranchers or Stover's)  NO OIL BASED VITAMINS - use powdered substitutes.   Continue performist and budesonide in your nebulizer (plan A)    Only use your albuterol (combivent is Plan B, duoneb is plan C) as a rescue medication to be used if you can't catch your breath by resting or doing a relaxed purse lip breathing pattern. The less you use it, the better it will work when you need it.   For cough > mucinex or mucinex dm as needed  Please schedule a follow up office visit in 4 weeks, sooner if needed

## 2012-04-15 NOTE — Assessment & Plan Note (Signed)
-   PFT's 03/11/12 wnl - Previous CT chest from [facility] requested 04/14/12 - Sinus CT 04/14/12 >>>  Most likely this is  Classic Upper airway cough syndrome, so named because it's frequently impossible to sort out how much is  CR/sinusitis with freq throat clearing (which can be related to primary GERD)   vs  causing  secondary (" extra esophageal")  GERD from wide swings in gastric pressure that occur with throat clearing, often  promoting self use of mint and menthol lozenges that reduce the lower esophageal sphincter tone and exacerbate the problem further in a cyclical fashion.   These are the same pts (now being labeled as having "irritable larynx syndrome" by some cough centers) who not infrequently have a history of having failed to tolerate ace inhibitors,  dry powder inhalers or biphosphonates or report having atypical reflux symptoms that don't respond to standard doses of PPI , and are easily confused as having aecopd or asthma flares by even experienced allergists/ pulmonologists.   For now continue max gerd rx, eval for sinus dz and bronchiectasis (previous CT from [facility] should suffice)

## 2012-04-15 NOTE — Assessment & Plan Note (Signed)
Adequate control on present rx, reviewed need to keep off any meds that contribute to cough until we get this problem irradicated.

## 2012-04-17 ENCOUNTER — Telehealth: Payer: Self-pay | Admitting: Pulmonary Disease

## 2012-04-17 NOTE — Telephone Encounter (Signed)
Order has been printed and faxed back to [facility].  Nothing further is needed

## 2012-04-18 ENCOUNTER — Telehealth: Payer: Self-pay | Admitting: Internal Medicine

## 2012-04-18 ENCOUNTER — Ambulatory Visit: Payer: Self-pay | Admitting: Internal Medicine

## 2012-04-18 ENCOUNTER — Ambulatory Visit: Payer: Medicare Other | Admitting: Internal Medicine

## 2012-04-18 NOTE — Telephone Encounter (Signed)
Order has been faxed back to [facility]  At [phone]  with ct sinus w/o written on this order.  Nothing further is needed.

## 2012-05-07 DIAGNOSIS — E041 Nontoxic single thyroid nodule: Secondary | ICD-10-CM

## 2012-05-07 HISTORY — DX: Nontoxic single thyroid nodule: E04.1

## 2012-05-27 ENCOUNTER — Ambulatory Visit: Payer: Self-pay | Admitting: General Surgery

## 2012-06-11 ENCOUNTER — Ambulatory Visit: Payer: Self-pay | Admitting: Internal Medicine

## 2012-06-14 ENCOUNTER — Encounter: Payer: Self-pay | Admitting: General Surgery

## 2012-06-14 DIAGNOSIS — E041 Nontoxic single thyroid nodule: Secondary | ICD-10-CM | POA: Insufficient documentation

## 2012-06-14 DIAGNOSIS — I509 Heart failure, unspecified: Secondary | ICD-10-CM | POA: Insufficient documentation

## 2012-06-14 DIAGNOSIS — J45909 Unspecified asthma, uncomplicated: Secondary | ICD-10-CM | POA: Insufficient documentation

## 2012-06-14 DIAGNOSIS — E119 Type 2 diabetes mellitus without complications: Secondary | ICD-10-CM | POA: Insufficient documentation

## 2012-06-14 DIAGNOSIS — C50919 Malignant neoplasm of unspecified site of unspecified female breast: Secondary | ICD-10-CM | POA: Insufficient documentation

## 2012-08-19 ENCOUNTER — Telehealth: Payer: Self-pay | Admitting: Internal Medicine

## 2012-08-19 DIAGNOSIS — R05 Cough: Secondary | ICD-10-CM

## 2012-08-19 MED ORDER — TRAMADOL HCL 50 MG PO TABS: ORAL_TABLET | ORAL | Status: DC

## 2012-08-19 NOTE — Telephone Encounter (Signed)
Spoke with patient States she has been coughing and it has worsened Possibly related to pollen. Requesting appt to be set up w MW in [place] only. Next available is May 5 Dr. Kendrick Fries next available May 6 Patient refused to come to [facility] office to be seen by any provider So patient is requesting Tussionex cough syrup be called in to pharmacy. Dr. Sherene Sires is this ok to be done   Last Ov:04/14/12 w 4 week follow up not done  [facility] [address]

## 2012-08-19 NOTE — Telephone Encounter (Signed)
Called and spoke with pt and she is aware of MW recs to use the mucinex and the tramadol.  Pt is aware the MW does not use tussionex.    Will forward to leslie to call  tomorrow to get ct sinus results from [place] or ct chest from [facility].  Nothing further needed today.

## 2012-08-19 NOTE — Telephone Encounter (Signed)
Chart review indicates we never got results of ct sinus from [place] or ct chest from [facility] so need to work on this  [facility] to use mucinex dm 1200 mg twice daily and tramadol 50 mg 1-2 up to every 4 hours as needed for cough #30 awaiting ov but I don't use tussionex for cough under any circumstances as too addicting

## 2012-08-19 NOTE — Telephone Encounter (Signed)
lmomtcb for pt 

## 2012-08-21 MED ORDER — AMOXICILLIN-POT CLAVULANATE 875-125 MG PO TABS: 1.0000 | ORAL_TABLET | Freq: Two times a day (BID) | ORAL | Status: DC

## 2012-08-21 MED ORDER — PREDNISONE 10 MG PO TABS: ORAL_TABLET | ORAL | Status: DC

## 2012-08-21 NOTE — Telephone Encounter (Signed)
Ct showed sinusitis so if not improving ok to rx with augmentin 857 bid x 10 days and Prednisone 10 mg take  4 each am x 2 days,   2 each am x 2 days,  1 each am x2days and stop between now and her next ov in [place]

## 2012-08-21 NOTE — Telephone Encounter (Signed)
I have faxed request to [facility] for CT Sinus scan and [facility] for ct chest Will await fax

## 2012-08-21 NOTE — Telephone Encounter (Signed)
Received ct neck results from [facility]- apparently she did not have a sinus scan done there Placed in MW lookat for review

## 2012-08-21 NOTE — Telephone Encounter (Signed)
Called and spoke with pt and she is aware of results per MW.  Pt was concerned that the last scan was done in December and she was just now getting these results back.  She is aware that per the last ov note with MW that she was to follow up with MW in 4 weeks and pt stated that she never did make this appt.  Pt is aware of meds sent in to the pharmacy per MW.  Pt to call back for appt.

## 2012-09-04 ENCOUNTER — Other Ambulatory Visit: Payer: Self-pay | Admitting: Internal Medicine

## 2012-09-08 ENCOUNTER — Ambulatory Visit (INDEPENDENT_AMBULATORY_CARE_PROVIDER_SITE_OTHER): Payer: Medicare Other | Admitting: Internal Medicine

## 2012-09-08 ENCOUNTER — Encounter: Payer: Self-pay | Admitting: Internal Medicine

## 2012-09-08 VITALS — BP 126/74 | HR 88 | Temp 98.3°F | Ht 62.0 in | Wt 149.0 lb

## 2012-09-08 DIAGNOSIS — R05 Cough: Secondary | ICD-10-CM

## 2012-09-08 NOTE — Patient Instructions (Addendum)
Augmentin 857  Twice daily  x 10 days and Prednisone 10 mg take 4 each am x 2 days, 2 each am x 2 days, 1 each am x2days and stop   If not better, next step is to repeat CT Sinus call Almyra Free [phone] to schedule and then see me back in [place] with all active medications in hand  Call us with name of tremor medication in meantime and make sure all your other medications are on our list

## 2012-09-08 NOTE — Progress Notes (Signed)
Subjective:    Patient ID: Tanya Montgomery, female    DOB: July 13, 1940  MRN: [id number]  HPI  38 yowf with lifelong heavy smoke exposure with ear and throat  Infections as child then onset of cough ever since severe flu in the 1960's referred 02/18/2012 to [facility] clinic by Dr Yates Decamp with completely nl pfts 03/11/12   02/18/2012 f/u pulmonary eval first visit to [facility] clinic / Wert cc daily "cough fit" x 50 years no pattern, min prod of thick ropy mucoid sputum but does occur pretty much every am and coughs so hard loses breath and bad urinary incont, better with nexium and nasal spray and best with nebulizer up to 3 x daily but never wakes up and needs this while sleeping. Only sob when coughing. Wallked all around [facility] 2013 without a problem. On nexium at hs rec Performist and budesonide twice daily in nebulizer and ok to use duoneb only as needed Nexium 40 mg Take 30-60 Take 30- 60 min before your first and last meals of the day  GERD diet   03/19/2012 f/u ov/[facility] no longer on performist/bud,  Using duoneb on avg every other day, when uses the neb it's for cough but only helps for a few hours,   On nexium 30 min before bfast and zantac after supper 150  rec Stop amlodipine and lopressor after today Tomorrow start bystolic 10 mg one daily "Add budesonide to a daily morning dose of duoneb and hold the budesonide for now" > she says caught this typo and thinks she started performist / bud bid and stopped duoneb (though not sure) which didn't feel she needed and still on  nexium/carafate plus zantac at hs.  04/14/12 ov/[facility] cc cough varies, confused with meds/ names.  rec We need to set up  sinus ct and obtain  the results of your last chest CT from [facility] Stop sucralfate (carafate) GERD    Continue performist and budesonide in your nebulizer (plan A)  Only use your albuterol (combivent is Plan B, duoneb is plan C) as a rescue medication to be used if  you can't catch your breath by resting or doing a relaxed purse lip breathing pattern. The less you use it, the better it will work when you need it.  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 why   09/08/2012 f/u ov/[facility] re cough not sure of meds Chief Complaint  Patient presents with  . Follow-up    Cough is much improved since the last visit. Taking mucinex and tramadol BID.   cough is worse in am's and brings up green mucus then takes tramadol and improves    No obvious daytime variabilty or assoc sob or cp or chest tightness, subjective wheeze overt sinus or hb symptoms. No unusual exp hx or h/o childhood pna/ asthma or premature birth to his knowledge.      Sleeping ok without nocturnal    exacerbation  of respiratory  c/o's or need for noct saba. Also denies any obvious fluctuation of symptoms with weather or environmental changes or other aggravating or alleviating factors except as outlined above     Kouffman Reflux v Neurogenic Cough Differentiator Reflux Comments  Do you awaken from a sound sleep coughing violently?  With trouble breathing? no   Do you have choking episodes when you cannot  Get enough air, gasping for air ?              gone   Do you usually cough when you lie down into  The bed, or when you just lie down to rest ?                          less   Do you usually cough after meals or eating?         no   Do you cough when (or after) you bend over?    Yes   GERD SCORE     Kouffman Reflux v Neurogenic Cough Differentiator Neurogenic   Do you more-or-less cough all day long? no   Does change of temperature make you cough? yes   Does laughing or chuckling cause you to cough? yes   Do fumes (perfume, automobile fumes, burned  Toast, etc.,) cause you to cough ?      yes   Does speaking, singing, or talking on the phone cause you to  cough   ?               yes   Neurogenic/Airway score      ROS  The following are not active complaints unless bolded sore throat, dysphagia, dental problems, itching, sneezing,  nasal congestion or excess/ purulent secretions, ear ache,   fever, chills, sweats, unintended wt loss, pleuritic or exertional cp, hemoptysis,  orthopnea pnd or leg swelling, presyncope, palpitations, heartburn, abdominal pain, anorexia, nausea, vomiting, diarrhea  or change in bowel or urinary habits, change in stools or urine, dysuria,hematuria,  rash, arthralgias, visual complaints, headache, numbness weakness or ataxia or problems with walking or coordination,  change in mood/affect or memory.           Objective:   Physical Exam  Very pleasant amb wf nad   Wt Readings from Last 3 Encounters:  09/08/12 149 lb (67.586 kg)  06/12/12 153 lb (69.4 kg)  04/14/12 150 lb (68.04 kg)     HEENT: nl dentition, turbinates, and orophanx. Nl external ear canals without cough reflex   NECK :  without JVD/Nodes/TM/ nl carotid upstrokes bilaterally   LUNGS: no acc muscle use, clear to A and P bilaterally with  Cough somewhat Reproducible on exp maneuvers   CV:  RRR  no s3 or murmur or increase in P2, no edema   ABD:  soft and nontender with nl excursion in the supine position. No bruits or organomegaly, bowel sounds nl  MS:  warm without deformities, calf tenderness, cyanosis or clubbing  SKIN: warm and dry without lesions               Assessment & Plan:

## 2012-09-08 NOTE — Assessment & Plan Note (Signed)
-   PFT's 03/11/12 wnl - Previous CT chest from [facility] requested 04/14/12 > again 09/08/2012  - Sinus CT 05/27/12 > Pos  L sphenoid sinusitis with a/f level  Cough only improves on tramadol with evidence of active sinus dz by sinus ct > rec  rx with augmentin/ prednisone then consider repeat ct and ent eval if can't clear the a/f levels.  I had an extended discussion with the patient today lasting 15 to 20 minutes of a 25 minute visit on the following issues:   See instructions for specific recommendations which were reviewed directly with the patient who was given a copy with highlighter outlining the key components. She is having trouble processing instructions so reviewed line by line with reasoning behind sequence of recs and emphasized the use of tramadol was not intended to be chronic suppression but only temporary relief

## 2012-09-12 ENCOUNTER — Other Ambulatory Visit: Payer: Self-pay | Admitting: Internal Medicine

## 2012-09-27 ENCOUNTER — Other Ambulatory Visit: Payer: Self-pay | Admitting: Pulmonary Disease

## 2012-09-27 ENCOUNTER — Other Ambulatory Visit: Payer: Self-pay | Admitting: Internal Medicine

## 2012-09-27 MED ORDER — TRAMADOL HCL 50 MG PO TABS: ORAL_TABLET | ORAL | Status: DC

## 2012-10-06 ENCOUNTER — Telehealth: Payer: Self-pay | Admitting: Internal Medicine

## 2012-10-06 DIAGNOSIS — R05 Cough: Secondary | ICD-10-CM

## 2012-10-06 MED ORDER — TRAMADOL HCL 50 MG PO TABS: ORAL_TABLET | ORAL | Status: DC

## 2012-10-06 NOTE — Telephone Encounter (Signed)
Rx refilled x 1 only Appt with MW with all meds made for 10/20/12 at the [facility] clinic

## 2012-10-06 NOTE — Telephone Encounter (Signed)
Spoke with patient, patient states her cough has improved some but not completely. Would like to proceed with CT sinus per last OV instructions CT has been ordered patient aware Nothing further needed at this time.  Will forward to Dr. Sherene Sires so that he is aware  Last OV: Patient Instructions    Augmentin 857 Twice daily x 10 days and Prednisone 10 mg take 4 each am x 2 days, 2 each am x 2 days, 1 each am x2days and stop  If not better, next step is to repeat CT Sinus call Almy[facility] Free [phone] to schedule and then see me back in [place] with all active medications in hand  Call us with name of tremor medication in meantime and make sure all your other medications are on our list

## 2012-10-06 NOTE — Telephone Encounter (Signed)
I believe this is duplicate.

## 2012-10-06 NOTE — Telephone Encounter (Signed)
Ok with one refill but then ov with all meds in hand

## 2012-10-06 NOTE — Telephone Encounter (Signed)
Patient also requesting refill for tramadol states this is the only thing that has helped after "years and years with this problem" Can this med be refilled? last filled: 09/27/12 #20 0 refills

## 2012-10-06 NOTE — Telephone Encounter (Signed)
Pt asked to add the need for a "renewall" on her tramadol.  Pt states that she is completely out of this.  Pt's pharmacy is [facility] on [address] in [place].  Antionette Fairy

## 2012-10-08 ENCOUNTER — Telehealth: Payer: Self-pay | Admitting: Internal Medicine

## 2012-10-08 NOTE — Telephone Encounter (Signed)
THIS IS FOR TOMORROW.  NEEDS TO BE TAKEN CARE OF ASAP.  FAX ORDER TO [id number].  Please call w/ an update ASAP to [phone]

## 2012-10-08 NOTE — Telephone Encounter (Signed)
There is order placed 10/06/12. Please advise PCC's thanks

## 2012-10-08 NOTE — Telephone Encounter (Signed)
Called and spoke with Terri. Order for CT Sinus faxed to [phone]. Confirmation from fax received. Nothing else needed on this message. Rhonda J Cobb

## 2012-10-09 ENCOUNTER — Ambulatory Visit: Payer: Self-pay | Admitting: Internal Medicine

## 2012-10-09 ENCOUNTER — Other Ambulatory Visit: Payer: Medicare Other

## 2012-10-20 ENCOUNTER — Encounter: Payer: Self-pay | Admitting: Internal Medicine

## 2012-10-20 ENCOUNTER — Ambulatory Visit (INDEPENDENT_AMBULATORY_CARE_PROVIDER_SITE_OTHER): Payer: Medicare Other | Admitting: Internal Medicine

## 2012-10-20 VITALS — BP 130/76 | HR 77 | Temp 98.1°F | Ht 62.5 in | Wt 142.0 lb

## 2012-10-20 DIAGNOSIS — R05 Cough: Secondary | ICD-10-CM

## 2012-10-20 NOTE — Patient Instructions (Addendum)
Continue to use tramadol 50 mg up twice daily - we can refill this when due  Ok to leave off the inhalers if breathing ok  We call about your sinus ct  Please schedule a follow up visit in 3 months but call sooner if needed

## 2012-10-20 NOTE — Progress Notes (Signed)
Subjective:    Patient ID: Tanya Montgomery, female    DOB: [birth year]  MRN: [id number]    Brief patient profile:  34 yowf with lifelong heavy smoke exposure with ear and throat  Infections as child then onset of cough ever since severe flu in the 1960's referred 02/18/2012 to [facility] clinic by Dr Yates Decamp with completely nl pfts 03/11/12   02/18/2012 f/u pulmonary eval first visit to [facility] clinic / Tanya Montgomery cc daily "cough fit" x 50 years no pattern, min prod of thick ropy mucoid sputum but does occur pretty much every am and coughs so hard loses breath and bad urinary incont, better with nexium and nasal spray and best with nebulizer up to 3 x daily but never wakes up and needs this while sleeping. Only sob when coughing. Wallked all around [facility] 2013 without a problem. On nexium at hs rec Performist and budesonide twice daily in nebulizer and ok to use duoneb only as needed Nexium 40 mg Take 30-60 Take 30- 60 min before your first and last meals of the day  GERD diet   03/19/2012 f/u ov/[facility] no longer on performist/bud,  Using duoneb on avg every other day, when uses the neb it's for cough but only helps for a few hours,   On nexium 30 min before bfast and zantac after supper 150  rec Stop amlodipine and lopressor after today Tomorrow start bystolic 10 mg one daily "Add budesonide to a daily morning dose of duoneb and hold the budesonide for now" > she says caught this typo and thinks she started performist / bud bid and stopped duoneb (though not sure) which didn't feel she needed and still on  nexium/carafate plus zantac at hs.  04/14/12 ov/Tanya Montgomery cc cough varies, confused with meds/ names.  rec We need to set up  sinus ct and obtain  the results of your last chest CT from [facility] Stop sucralfate (carafate) GERD    Continue performist and budesonide in your nebulizer (plan A)  Only use your albuterol (combivent is Plan B, duoneb is plan C) as a rescue  medication to be used if you can't catch your breath by resting or doing a relaxed purse lip breathing pattern. The less you use it, the better it will work when you need it.  For cough > mucinex or mucinex dm as needed Sinus CT 05/27/12 > Pos  L sphenoid sinusitis with a/f level > rec augmentin/pred but did not take ? why   09/08/2012 f/u ov/Tanya Montgomery re cough not sure of meds Chief Complaint  Patient presents with  . Follow-up    Cough is much improved since the last visit. Taking mucinex and tramadol BID.   cough is worse in am's and brings up green mucus then takes tramadol and improves rec Augmentin 857  Twice daily  x 10 days and Prednisone 10 mg take 4 each am x 2 days, 2 each am x 2 days, 1 each am x2days and stop  If not better, next step is to repeat CT Sinus call Tanya Montgomery [phone] to schedule and then see me back in [place] with all active medications in hand   10/20/2012 f/u ov/Tanya Montgomery off all inhalers x weeks no worse just using one tramadol a day Chief Complaint  Patient presents with  . Follow-up    Pt states that her cough is much improved since her last visit. No new c/os today.   still has am cough prod slt yellow but not  dark green mucus  No obvious daytime variabilty or assoc sob or cp or chest tightness, subjective wheeze overt sinus or hb symptoms. No unusual exp hx or h/o childhood pna/ asthma or knowledge of premature birth.    No obvious daytime variabilty or assoc sob or cp or chest tightness, subjective wheeze overt sinus or hb symptoms. No unusual exp hx or h/o childhood pna/ asthma or premature birth to his knowledge.    Sleeping ok without nocturnal    exacerbation  of respiratory  c/o's or need for noct saba. Also denies any obvious fluctuation of symptoms with weather or environmental changes or other aggravating or alleviating factors except as outlined above      Current Medications, Allergies, Past Medical History, Past Surgical History, Family History, and  Social History were reviewed in [employer] record.  ROS  The following are not active complaints unless bolded sore throat, dysphagia, dental problems, itching, sneezing,  nasal congestion or excess/ purulent secretions, ear ache,   fever, chills, sweats, unintended wt loss, pleuritic or exertional cp, hemoptysis,  orthopnea pnd or leg swelling, presyncope, palpitations, heartburn, abdominal pain, anorexia, nausea, vomiting, diarrhea  or change in bowel or urinary habits, change in stools or urine, dysuria,hematuria,  rash, arthralgias, visual complaints, headache, numbness weakness or ataxia or problems with walking or coordination,  change in mood/affect or memory.               Objective:   Physical Exam  Very pleasant amb wf nad  10/20/2012        142  Wt Readings from Last 3 Encounters:  09/08/12 149 lb (67.586 kg)  06/12/12 153 lb (69.4 kg)  04/14/12 150 lb (68.04 kg)     HEENT: nl dentition, turbinates, and orophanx. Nl external ear canals without cough reflex   NECK :  without JVD/Nodes/TM/ nl carotid upstrokes bilaterally   LUNGS: no acc muscle use, clear to A and P bilaterally with  Cough somewhat Reproducible on exp maneuvers   CV:  RRR  no s3 or murmur or increase in P2, no edema   ABD:  soft and nontender with nl excursion in the supine position. No bruits or organomegaly, bowel sounds nl  MS:  warm without deformities, calf tenderness, cyanosis or clubbing  SKIN: warm and dry without lesions               Assessment & Plan:

## 2012-10-20 NOTE — Assessment & Plan Note (Addendum)
-   PFT's 03/11/12 wnl - Previous CT chest from [facility] requested 04/14/12 > again 09/08/2012  - Sinus CT 05/27/12 > Pos  L sphenoid sinusitis with a/f level > resolved  10/09/12    Improved but still tramadol dependent though never using more than 2 per day.  The am discolored sputum suggests persistent sinusitis but not apparent by repeat ct 10/09/12 so ok to continue prn antihistamines though prefer 1st gen h1 if tolerates    Each maintenance medication was reviewed in detail including most importantly the difference between maintenance and as needed and under what circumstances the prns are to be used.  Please see instructions for details which were reviewed in writing and the patient given a copy.

## 2012-10-23 ENCOUNTER — Ambulatory Visit: Payer: Self-pay | Admitting: Oncology

## 2012-10-31 ENCOUNTER — Other Ambulatory Visit: Payer: Self-pay | Admitting: Internal Medicine

## 2012-11-03 LAB — CBC CANCER CENTER
Basophil #: 0 x10 3/mm (ref 0.0–0.1)
Basophil %: 0.5 %
Eosinophil %: 0.8 %
HCT: 38.3 % (ref 35.0–47.0)
Lymphocyte #: 1.7 x10 3/mm (ref 1.0–3.6)
MCH: 33.5 pg (ref 26.0–34.0)
MCHC: 34.5 g/dL (ref 32.0–36.0)
MCV: 97 fL (ref 80–100)
Monocyte #: 0.6 x10 3/mm (ref 0.2–0.9)
Monocyte %: 8.6 %
Neutrophil #: 4.2 x10 3/mm (ref 1.4–6.5)
Neutrophil %: 64.1 %
RBC: 3.93 10*6/uL (ref 3.80–5.20)
WBC: 6.6 x10 3/mm (ref 3.6–11.0)

## 2012-11-03 LAB — COMPREHENSIVE METABOLIC PANEL
Albumin: 4 g/dL (ref 3.4–5.0)
Alkaline Phosphatase: 62 U/L (ref 50–136)
Anion Gap: 7 (ref 7–16)
BUN: 18 mg/dL (ref 7–18)
Bilirubin,Total: 0.5 mg/dL (ref 0.2–1.0)
Calcium, Total: 9.2 mg/dL (ref 8.5–10.1)
Co2: 32 mmol/L (ref 21–32)
Creatinine: 1.19 mg/dL (ref 0.60–1.30)
EGFR (African American): 52 — ABNORMAL LOW
EGFR (Non-African Amer.): 45 — ABNORMAL LOW
Glucose: 192 mg/dL — ABNORMAL HIGH (ref 65–99)
Potassium: 4.2 mmol/L (ref 3.5–5.1)

## 2012-11-04 ENCOUNTER — Ambulatory Visit: Payer: Self-pay | Admitting: Oncology

## 2012-12-04 LAB — CBC CANCER CENTER
Eosinophil %: 1 %
HCT: 31.4 % — ABNORMAL LOW (ref 35.0–47.0)
Lymphocyte %: 28.8 %
MCH: 32.9 pg (ref 26.0–34.0)
Neutrophil #: 3 x10 3/mm (ref 1.4–6.5)
Platelet: 302 x10 3/mm (ref 150–440)
RBC: 3.28 10*6/uL — ABNORMAL LOW (ref 3.80–5.20)
RDW: 15 % — ABNORMAL HIGH (ref 11.5–14.5)

## 2012-12-04 LAB — COMPREHENSIVE METABOLIC PANEL
Alkaline Phosphatase: 97 U/L (ref 50–136)
BUN: 16 mg/dL (ref 7–18)
Bilirubin,Total: 0.4 mg/dL (ref 0.2–1.0)
Calcium, Total: 9.2 mg/dL (ref 8.5–10.1)
Chloride: 102 mmol/L (ref 98–107)
Co2: 28 mmol/L (ref 21–32)
Creatinine: 1.04 mg/dL (ref 0.60–1.30)
EGFR (African American): >60
EGFR (Non-African Amer.): 53 — ABNORMAL LOW
Glucose: 193 mg/dL — ABNORMAL HIGH (ref 65–99)
Potassium: 4.6 mmol/L (ref 3.5–5.1)
SGPT (ALT): 53 U/L (ref 12–78)
Total Protein: 6.7 g/dL (ref 6.4–8.2)

## 2012-12-05 ENCOUNTER — Ambulatory Visit: Payer: Self-pay | Admitting: Oncology

## 2012-12-05 LAB — CANCER ANTIGEN 27.29: CA 27.29: 14.2 U/mL (ref 0.0–38.6)

## 2012-12-12 ENCOUNTER — Other Ambulatory Visit: Payer: Self-pay | Admitting: Internal Medicine

## 2012-12-19 ENCOUNTER — Other Ambulatory Visit: Payer: Self-pay | Admitting: Internal Medicine

## 2012-12-22 NOTE — Telephone Encounter (Signed)
EPIC shows tramadol last refilled 12/12/12 #40 x0 refills please advise MW thanks

## 2012-12-23 ENCOUNTER — Telehealth: Payer: Self-pay | Admitting: Internal Medicine

## 2012-12-23 MED ORDER — TRAMADOL HCL 50 MG PO TABS: ORAL_TABLET | ORAL | Status: DC

## 2012-12-23 NOTE — Telephone Encounter (Signed)
Pt informed that refill for Tramadol was called to pharmacy 

## 2013-01-13 ENCOUNTER — Encounter: Payer: Self-pay | Admitting: Orthopedic Surgery

## 2013-01-20 ENCOUNTER — Ambulatory Visit (INDEPENDENT_AMBULATORY_CARE_PROVIDER_SITE_OTHER): Payer: Medicare Other | Admitting: Internal Medicine

## 2013-01-20 ENCOUNTER — Encounter: Payer: Self-pay | Admitting: Internal Medicine

## 2013-01-20 VITALS — BP 140/70 | HR 79 | Temp 97.8°F | Ht 62.5 in | Wt 142.6 lb

## 2013-01-20 DIAGNOSIS — R05 Cough: Secondary | ICD-10-CM

## 2013-01-20 MED ORDER — TRAMADOL HCL 50 MG PO TABS: 50.0000 mg | ORAL_TABLET | Freq: Four times a day (QID) | ORAL | Status: DC | PRN

## 2013-01-20 NOTE — Patient Instructions (Addendum)
Since we have not been able to obtain any records from [facility] and your CT scans were done there we can't comment on whether or not the nodules have anything to do with cough but they may  For now ok to use tramadol up to twice daily   Follow up will be with Dr Kendrick Fries in the [place] [facility]

## 2013-01-20 NOTE — Progress Notes (Signed)
Subjective:    Patient ID: Tanya Montgomery, female    DOB: 08/24/1940  MRN: [id number]    Brief patient profile:  27 yowf with lifelong heavy smoke exposure with ear and throat  Infections as child then onset of cough ever since severe flu in the 1960's referred 02/18/2012 to [facility] clinic by Dr Yates Decamp with completely nl pfts 03/11/12   02/18/2012 f/u pulmonary eval first visit to [facility] clinic / Deloros Beretta cc daily "cough fit" x 50 years no pattern, min prod of thick ropy mucoid sputum but does occur pretty much every am and coughs so hard loses breath and bad urinary incont, better with nexium and nasal spray and best with nebulizer up to 3 x daily but never wakes up and needs this while sleeping. Only sob when coughing. Wallked all around [facility] 2013 without a problem. On nexium at hs rec Performist and budesonide twice daily in nebulizer and ok to use duoneb only as needed Nexium 40 mg Take 30-60 Take 30- 60 min before your first and last meals of the day  GERD diet   03/19/2012 f/u ov/[facility] no longer on performist/bud,  Using duoneb on avg every other day, when uses the neb it's for cough but only helps for a few hours,   On nexium 30 min before bfast and zantac after supper 150  rec Stop amlodipine and lopressor after today Tomorrow start bystolic 10 mg one daily "Add budesonide to a daily morning dose of duoneb and hold the budesonide for now" > she says caught this typo and thinks she started performist / bud bid and stopped duoneb (though not sure) which didn't feel she needed and still on  nexium/carafate plus zantac at hs.  04/14/12 ov/Laberta Wilbon cc cough varies, confused with meds/ names.  rec We need to set up  sinus ct and obtain  the results of your last chest CT from [facility] Stop sucralfate (carafate) GERD    Continue performist and budesonide in your nebulizer (plan A)  Only use your albuterol (combivent is Plan B, duoneb is plan C) as a rescue  medication to be used if you can't catch your breath by resting or doing a relaxed purse lip breathing pattern. The less you use it, the better it will work when you need it.  For cough > mucinex or mucinex dm as needed Sinus CT 05/27/12 > Pos  L sphenoid sinusitis with a/f level > rec augmentin/pred but did not take ? why   09/08/2012 f/u ov/Bular Hickok re cough not sure of meds Chief Complaint  Patient presents with  . Follow-up    Cough is much improved since the last visit. Taking mucinex and tramadol BID.   cough is worse in am's and brings up green mucus then takes tramadol and improves rec Augmentin 857  Twice daily  x 10 days and Prednisone 10 mg take 4 each am x 2 days, 2 each am x 2 days, 1 each am x2days and stop  If not better, next step is to repeat CT Sinus call Almyra Free [phone] to schedule and then see me back in [place] with all active medications in hand   10/20/2012 f/u ov/Caral Whan off all inhalers x weeks no worse just using one tramadol a day Chief Complaint  Patient presents with  . Follow-up    Pt states that her cough is much improved since her last visit. No new c/os today.   still has am cough prod slt yellow but not  dark green mucus rec Continue to use tramadol 50 mg up twice daily - we can refill this when due Ok to leave off the inhalers if breathing ok Sinus ct repeated > neg      01/21/2013 f/u ov/Yoshiko Keleher [id number] cough  Chief Complaint  Patient presents with  . Follow-up    pt reports cough is GREATLY improved, only taking (1) tramadol daily --- would like to be seen in our [place] office as this is closer to her home     No obvious daytime variabilty or assoc sob or cp or chest tightness, subjective wheeze overt sinus or hb symptoms. No unusual exp hx or h/o childhood pna/ asthma or premature birth to his knowledge.    Sleeping ok without nocturnal    exacerbation  of respiratory  c/o's or need for noct saba. Also denies any obvious fluctuation of symptoms with  weather or environmental changes or other aggravating or alleviating factors except as outlined above      Current Medications, Allergies, Past Medical History, Past Surgical History, Family History, and Social History were reviewed in [employer] record.  ROS  The following are not active complaints unless bolded sore throat, dysphagia, dental problems, itching, sneezing,  nasal congestion or excess/ purulent secretions, ear ache,   fever, chills, sweats, unintended wt loss, pleuritic or exertional cp, hemoptysis,  orthopnea pnd or leg swelling, presyncope, palpitations, heartburn, abdominal pain, anorexia, nausea, vomiting, diarrhea  or change in bowel or urinary habits, change in stools or urine, dysuria,hematuria,  rash, arthralgias, visual complaints, headache, numbness weakness or ataxia or problems with walking or coordination,  change in mood/affect or memory.               Objective:   Physical Exam  Very pleasant amb wf nad  01/20/2013         142  10/20/2012        142  Wt Readings from Last 3 Encounters:  09/08/12 149 lb (67.586 kg)  06/12/12 153 lb (69.4 kg)  04/14/12 150 lb (68.04 kg)     HEENT: nl dentition, turbinates, and orophanx. Nl external ear canals without cough reflex   NECK :  without JVD/Nodes/TM/ nl carotid upstrokes bilaterally   LUNGS: no acc muscle use, clear to A and P bilaterally   CV:  RRR  no s3 or murmur or increase in P2, no edema   ABD:  soft and nontender with nl excursion in the supine position. No bruits or organomegaly, bowel sounds nl  MS:  warm without deformities, calf tenderness, cyanosis or clubbing  SKIN: warm and dry without lesions               Assessment & Plan:

## 2013-01-21 NOTE — Assessment & Plan Note (Signed)
-   PFT's 03/11/12 wnl - Previous [facility] eval 06/2011 cxr : X-ray chest PA and lateral - Final result (06/17/2011 8:35 PM EST)  Narrative  Verified  Procedure: Two-view (Posteroanterior and lateral projections) chest.  Indication: [id number] SOB  Compare: CT chest 07/19/2009  Findings: The heart and mediastinum are unchanged compared to prior, with  mild tortuosity of the thoracic aorta and transverse aortic  calcifications. Small middle lobe nodule likely corresponds to nodule  seen on Chest Ct 07/19/2009; without comparison to prior chest radiograph,  evaluation for change in size is not possible. Pleural spaces are normal.  Thoracic spine degenerative changes. Surgical clips overlie the left  axilla. Cholecystectomy clips.  Impression: Small middle lobe nodule likely corresponds to nodule seen on  Chest Ct 07/19/2009, favored to be benign given stability on multiple  prior CTs; however, without comparison to prior chest radiograph,  evaluation for stability is not possible on current radiograph.    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   These are the same pts (now being labeled as having "irritable larynx syndrome" by some cough centers) who not infrequently have a history of having failed to tolerate ace inhibitors,  dry powder inhalers or biphosphonates or report having atypical reflux symptoms that don't respond to standard doses of PPI , and are  easily confused as having aecopd or asthma flares by even experienced allergists/ pulmonologists.   Ok to use low doses of tramadol to control cyclical cough

## 2013-01-29 ENCOUNTER — Ambulatory Visit: Payer: Self-pay | Admitting: Oncology

## 2013-02-02 ENCOUNTER — Ambulatory Visit: Payer: Self-pay | Admitting: Oncology

## 2013-02-04 ENCOUNTER — Encounter: Payer: Self-pay | Admitting: Orthopedic Surgery

## 2013-02-04 ENCOUNTER — Ambulatory Visit: Payer: Self-pay | Admitting: Oncology

## 2013-03-07 ENCOUNTER — Encounter: Payer: Self-pay | Admitting: Orthopedic Surgery

## 2013-03-10 ENCOUNTER — Telehealth: Payer: Self-pay | Admitting: Internal Medicine

## 2013-03-10 NOTE — Telephone Encounter (Signed)
[name] ° °

## 2013-03-16 ENCOUNTER — Ambulatory Visit (INDEPENDENT_AMBULATORY_CARE_PROVIDER_SITE_OTHER): Payer: Medicare Other | Admitting: Internal Medicine

## 2013-03-16 ENCOUNTER — Encounter: Payer: Self-pay | Admitting: Internal Medicine

## 2013-03-16 ENCOUNTER — Ambulatory Visit (INDEPENDENT_AMBULATORY_CARE_PROVIDER_SITE_OTHER)
Admission: RE | Admit: 2013-03-16 | Discharge: 2013-03-16 | Disposition: A | Discharge status: Home / Self Care | Payer: Medicare Other | Source: Ambulatory Visit | Attending: Internal Medicine | Admitting: Internal Medicine

## 2013-03-16 VITALS — BP 112/74 | HR 74 | Temp 98.0°F | Ht 62.25 in | Wt 145.0 lb

## 2013-03-16 DIAGNOSIS — R911 Solitary pulmonary nodule: Secondary | ICD-10-CM | POA: Insufficient documentation

## 2013-03-16 DIAGNOSIS — R05 Cough: Secondary | ICD-10-CM

## 2013-03-16 MED ORDER — TRAMADOL HCL 50 MG PO TABS: 50.0000 mg | ORAL_TABLET | Freq: Four times a day (QID) | ORAL | Status: DC | PRN

## 2013-03-16 NOTE — Assessment & Plan Note (Signed)
-   PFT's 03/11/12 wnl - Sinus CT 05/27/12 > Pos  L sphenoid sinusitis with a/f level > resolved  10/09/12   So in retrospect this is not asthma but  Classic Upper airway cough syndrome, so named because it's frequently impossible to sort out how much is  CR/sinusitis with freq throat clearing (which can be related to primary GERD)   vs  causing  secondary (" extra esophageal")  GERD from wide swings in gastric pressure that occur with throat clearing, often  promoting self use of mint and menthol lozenges that reduce the lower esophageal sphincter tone and exacerbate the problem further in a cyclical fashion.   These are the same pts (now being labeled as having "irritable larynx syndrome" by some cough centers) who not infrequently have a history of having failed to tolerate ace inhibitors,  dry powder inhalers or biphosphonates or report having atypical reflux symptoms that don't respond to standard doses of PPI , and are easily confused as having aecopd or asthma flares by even experienced allergists/ pulmonologists.  Will try change zyrtec to prn and continue with very low dose tramadol daily (other option neurontin trial)  F/u q 3 months

## 2013-03-16 NOTE — Patient Instructions (Addendum)
Please remember to go to the  x-ray department downstairs for your tests - we will call you with the results when they are available.  Try off the zyrtec to see if the cough flares or not - can use this as needed for itching, sneezing, runny nose or throat tickle     Please schedule a follow up visit in 3 months but call sooner if needed

## 2013-03-16 NOTE — Assessment & Plan Note (Signed)
- -   Previous [facility] eval 06/2011 cxr : X-ray chest PA and lateral - Final result (06/17/2011 8:35 PM EST)  Narrative  Verified  Procedure: Two-view (Posteroanterior and lateral projections) chest.  Indication: [id number] SOB  Compare: CT chest 07/19/2009  Findings: The heart and mediastinum are unchanged compared to prior, with  mild tortuosity of the thoracic aorta and transverse aortic  calcifications. Small middle lobe nodule likely corresponds to nodule  seen on Chest Ct 07/19/2009; without comparison to prior chest radiograph,  evaluation for change in size is not possible. Pleural spaces are normal.  Thoracic spine degenerative changes. Surgical clips overlie the left  axilla. Cholecystectomy clips.  Impression: Small middle lobe nodule likely corresponds to nodule seen on  Chest Ct 07/19/2009, favored to be benign given stability on multiple  prior CTs; however, without comparison to prior chest radiograph,  evaluation for stability is not possible on current radiograph.     cxr  03/16/2013  Clear > no further f/u planned

## 2013-03-16 NOTE — Progress Notes (Signed)
Subjective:    Patient ID: Tanya Montgomery, female    DOB: 06/01/1940  MRN: [id number]    Brief patient profile:  13 yowf with lifelong heavy smoke exposure with ear and throat  Infections as child then onset of cough ever since severe flu in the 1960's referred 02/18/2012 to [facility] clinic by Dr Yates Decamp with completely nl pfts 03/11/12 and subsequently able to stop all asthma rx without flare     History of Present Illness  02/18/2012 f/u pulmonary eval first visit to [facility] clinic / Tanya Montgomery cc daily "cough fit" x 50 years no pattern, min prod of thick ropy mucoid sputum but does occur pretty much every am and coughs so hard loses breath and bad urinary incont, better with nexium and nasal spray and best with nebulizer up to 3 x daily but never wakes up and needs this while sleeping. Only sob when coughing. Wallked all around [facility] 2013 without a problem. On nexium at hs rec Performist and budesonide twice daily in nebulizer and ok to use duoneb only as needed Nexium 40 mg Take 30-60 Take 30- 60 min before your first and last meals of the day  GERD diet   03/19/2012 f/u ov/Tanya Montgomery  clinic no longer on performist/bud,  Using duoneb on avg every other day, when uses the neb it's for cough but only helps for a few hours,   On nexium 30 min before bfast and zantac after supper 150  rec Stop amlodipine and lopressor after today Tomorrow start bystolic 10 mg one daily "Add budesonide to a daily morning dose of duoneb and hold the budesonide for now" > she says caught this typo and thinks she started performist / bud bid and stopped duoneb (though not sure) which didn't feel she needed and still on  nexium/carafate plus zantac at hs.  04/14/12 ov/Tanya Montgomery cc cough varies, confused with meds/ names.  rec We need to set up  sinus ct and obtain  the results of your last chest CT from [facility] Stop sucralfate (carafate) GERD    Continue performist and budesonide in your nebulizer  (plan A)  Only use your albuterol (combivent is Plan B, duoneb is plan C) as a rescue medication to be used if you can't catch your breath by resting or doing a relaxed purse lip breathing pattern. The less you use it, the better it will work when you need it.  For cough > mucinex or mucinex dm as needed Sinus CT 05/27/12 > Pos  L sphenoid sinusitis with a/f level > rec augmentin/pred but did not take ? why   09/08/2012 f/u ov/Tanya Montgomery re cough not sure of meds Chief Complaint  Patient presents with  . Follow-up    Cough is much improved since the last visit. Taking mucinex and tramadol BID.   cough is worse in am's and brings up green mucus then takes tramadol and improves rec Augmentin 857  Twice daily  x 10 days and Prednisone 10 mg take 4 each am x 2 days, 2 each am x 2 days, 1 each am x2days and stop  If not better, next step is to repeat CT Sinus call Almyra Free [phone] to schedule and then see me back in [place] with all active medications in hand   10/20/2012 f/u ov/Tanya Montgomery off all inhalers x weeks no worse just using one tramadol a day Chief Complaint  Patient presents with  . Follow-up    Pt states that her cough is much improved since her  last visit. No new c/os today.   still has am cough prod slt yellow but not dark green mucus rec Continue to use tramadol 50 mg up twice daily - we can refill this when due Ok to leave off the inhalers if breathing ok Sinus ct repeated > neg      01/21/2013 f/u ov/Tanya Montgomery [id number] cough  Chief Complaint  Patient presents with  . Follow-up    pt reports cough is GREATLY improved, only taking (1) tramadol daily --- would like to be seen in our [place] office as this is closer to her home  rec Since we have not been able to obtain any records from [facility] and your CT scans were done there we can't comment on whether or not the nodules have anything to do with cough but they may For now ok to use tramadol up to twice daily    03/16/2013 f/u ov/Tanya Montgomery  re: chronic cough x 50 y improved Chief Complaint  Patient presents with  . Follow-up    Pt states that her cough continues to improve, but still takes tramadol once in the am.       No obvious daytime variabilty or assoc sob or cp or chest tightness, subjective wheeze overt sinus or hb symptoms. No unusual exp hx or h/o childhood pna/ asthma or premature birth to his knowledge.    Sleeping ok without nocturnal    exacerbation  of respiratory  c/o's or need for noct saba. Also denies any obvious fluctuation of symptoms with weather or environmental changes or other aggravating or alleviating factors except as outlined above      Current Medications, Allergies, Past Medical History, Past Surgical History, Family History, and Social History were reviewed in [employer] record.  ROS  The following are not active complaints unless bolded sore throat, dysphagia, dental problems, itching, sneezing,  nasal congestion or excess/ purulent secretions, ear ache,   fever, chills, sweats, unintended wt loss, pleuritic or exertional cp, hemoptysis,  orthopnea pnd or leg swelling, presyncope, palpitations, heartburn, abdominal pain, anorexia, nausea, vomiting, diarrhea  or change in bowel or urinary habits, change in stools or urine, dysuria,hematuria,  rash, arthralgias, visual complaints, headache, numbness weakness or ataxia or problems with walking or coordination,  change in mood/affect or memory.               Objective:   Physical Exam  Very pleasant amb wf nad   . Wt Readings from Last 3 Encounters:  03/16/13 145 lb (65.772 kg)  01/20/13 142 lb 9.6 oz (64.683 kg)  10/20/12 142 lb (64.411 kg)        HEENT: nl dentition, turbinates, and orophanx. Nl external ear canals without cough reflex   NECK :  without JVD/Nodes/TM/ nl carotid upstrokes bilaterally   LUNGS: no acc muscle use, clear to A and P bilaterally   CV:  RRR  no s3 or murmur or increase in P2,  no edema   ABD:  soft and nontender with nl excursion in the supine position. No bruits or organomegaly, bowel sounds nl  MS:  warm without deformities, calf tenderness, cyanosis or clubbing  SKIN: warm and dry without lesions     CXR  03/16/2013 :   No active cardiopulmonary disease.             Assessment & Plan:

## 2013-03-17 NOTE — Progress Notes (Signed)
Quick Note:  Spoke with pt and notified of results per Dr. Wert. Pt verbalized understanding and denied any questions.  ______ 

## 2013-04-06 ENCOUNTER — Encounter: Payer: Self-pay | Admitting: Orthopedic Surgery

## 2013-05-04 ENCOUNTER — Other Ambulatory Visit: Payer: Self-pay | Admitting: Internal Medicine

## 2013-05-04 MED ORDER — NEBIVOLOL HCL 10 MG PO TABS: 10.0000 mg | ORAL_TABLET | Freq: Every day | ORAL | Status: DC

## 2013-05-05 ENCOUNTER — Other Ambulatory Visit: Payer: Self-pay | Admitting: Internal Medicine

## 2013-05-05 MED ORDER — NEBIVOLOL HCL 10 MG PO TABS: 10.0000 mg | ORAL_TABLET | Freq: Every day | ORAL | Status: DC

## 2013-05-07 ENCOUNTER — Encounter: Payer: Self-pay | Admitting: Orthopedic Surgery

## 2013-05-15 ENCOUNTER — Telehealth: Payer: Self-pay | Admitting: Internal Medicine

## 2013-05-15 MED ORDER — TRAMADOL HCL 50 MG PO TABS: 50.0000 mg | ORAL_TABLET | Freq: Four times a day (QID) | ORAL | Status: DC | PRN

## 2013-05-15 NOTE — Telephone Encounter (Signed)
Pt advised and appt set for 06-16-13 at 1:30. rx sent. Hooven Bing, CMA

## 2013-05-15 NOTE — Telephone Encounter (Signed)
Tramadol last refilled 03/16/13 #100 x 0 refills.  Last OV 03/16/13 No pending appt.  Please advise MW if okay to refill thanks

## 2013-05-15 NOTE — Telephone Encounter (Signed)
Ok t refill x 100 but ov before next one should already be scheduled and needs to keep it

## 2013-06-07 ENCOUNTER — Encounter: Payer: Self-pay | Admitting: Orthopedic Surgery

## 2013-06-08 ENCOUNTER — Ambulatory Visit: Payer: Self-pay | Admitting: Unknown Physician Specialty

## 2013-06-16 ENCOUNTER — Encounter: Payer: Self-pay | Admitting: Internal Medicine

## 2013-06-16 ENCOUNTER — Ambulatory Visit (INDEPENDENT_AMBULATORY_CARE_PROVIDER_SITE_OTHER): Payer: Medicare Other | Admitting: Internal Medicine

## 2013-06-16 VITALS — BP 132/76 | HR 73 | Ht 62.25 in | Wt 146.2 lb

## 2013-06-16 DIAGNOSIS — R053 Chronic cough: Secondary | ICD-10-CM

## 2013-06-16 DIAGNOSIS — R05 Cough: Secondary | ICD-10-CM

## 2013-06-16 DIAGNOSIS — R059 Cough, unspecified: Secondary | ICD-10-CM

## 2013-06-16 MED ORDER — TRAMADOL HCL 50 MG PO TABS: 50.0000 mg | ORAL_TABLET | Freq: Four times a day (QID) | ORAL | Status: DC | PRN

## 2013-06-16 NOTE — Progress Notes (Signed)
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  Infections as child then onset of cough ever since severe flu in the 1960's referred 02/18/2012 to [facility] clinic by Dr Lisette Grinder with completely nl pfts 03/11/12 and subsequently able to stop all asthma rx without flare but still persistent cough only responsive to tramadol    History of Present Illness  02/18/2012 f/u pulmonary eval first visit to [facility] / Wilba Mutz cc daily "cough fit" x 50 years no pattern, min prod of thick ropy mucoid sputum but does occur pretty much every am and coughs so hard loses breath and bad urinary incont, better with nexium and nasal spray and best with nebulizer up to 3 x daily but never wakes up and needs this while sleeping. Only sob when coughing. Wallked all around [facility] 2013 without a problem. On nexium at hs rec Performist and budesonide twice daily in nebulizer and ok to use duoneb only as needed Nexium 40 mg Take 30-60 Take 30- 60 min before your first and last meals of the day  GERD diet   03/19/2012 f/u ov/Estef[facility] no longer on performist/bud,  Using duoneb on avg every other day, when uses the neb it's for cough but only helps for a few hours,   On nexium 30 min before bfast and zantac after supper 150  rec Stop amlodipine and lopressor after today Tomorrow start bystolic 10 mg one daily "Add budesonide to a daily morning dose of duoneb and hold the budesonide for now" > she says caught this typo and thinks she started performist / bud bid and stopped duoneb (though not sure) which didn't feel she needed and still on  nexium/carafate plus zantac at hs.  04/14/12 ov/Ala Kratz cc cough varies, confused with meds/ names.  rec We need to set up  sinus ct and obtain  the results of your last chest CT from [facility] Stop  sucralfate (carafate) GERD    Continue performist and budesonide in your nebulizer (plan A)  Only use your albuterol (combivent is Plan B, duoneb is plan C) as a rescue medication to be used if you can't catch your breath by resting or doing a relaxed purse lip breathing pattern. The less you use it, the better it will work when you need it.  For cough > mucinex or mucinex dm as needed Sinus CT 05/27/12 > Pos  L sphenoid sinusitis with a/f level > rec augmentin/pred but did not take ? why   09/08/2012 f/u ov/Njeri Vicente re cough not sure of meds Chief Complaint  Patient presents with  . Follow-up    Cough is much improved since the last visit. Taking mucinex and tramadol BID.   cough is worse in am's and brings up green mucus then takes tramadol and improves rec Augmentin 857  Twice daily  x 10 days and Prednisone 10 mg take 4 each am x 2 days, 2 each am x 2 days, 1 each am x2days and stop  If not better, next step is to repeat CT Sinus call [facility] [id number] to schedule and then see me back in [place] with all active medications in hand   10/20/2012 f/u ov/Venus Gilles off all inhalers x weeks no worse just using one tramadol a day Chief Complaint  Patient presents with  . Follow-up  Pt states that her cough is much improved since her last visit. No new c/os today.   still has am cough prod slt yellow but not dark green mucus rec Continue to use tramadol 50 mg up twice daily - we can refill this when due Ok to leave off the inhalers if breathing ok Sinus ct repeated > neg      01/21/2013 f/u ov/Sheryle Vice [id number] cough  Chief Complaint  Patient presents with  . Follow-up    pt reports cough is GREATLY improved, only taking (1) tramadol daily --- would like to be seen in our [place] office as this is closer to her home  rec Since we have not been able to obtain any records from [facility] and your CT scans were done there we can't comment on whether or not the nodules have anything to do with cough but  they may For now ok to use tramadol up to twice daily    03/16/2013 f/u ov/Aoi Kouns re: chronic cough x 50 y improved Chief Complaint  Patient presents with  . Follow-up    Pt states that her cough continues to improve, but still takes tramadol once in the am.    rec Please remember to go to the  x-ray department downstairs for your tests - we will call you with the results when they are available. Try off the zyrtec to see if the cough flares or not - can use this as needed for itching, sneezing, runny nose or throat tickle.   06/16/2013 f/u ov/Kaylin Schellenberg re: cough x > 30 y main on one tramadol each am Chief Complaint  Patient presents with  . Follow-up    Pt c/o minimal cough.  Pt states she is happy with her progress and current results.        Not limited by breathing from desired activities    Does have freq pnds sensation, zyrtec helps some when she remembers to take it.  No obvious daytime variabilty or assoc sob or cp or chest tightness, subjective wheeze overt sinus or hb symptoms. No unusual exp hx or h/o childhood pna/ asthma or premature birth to his knowledge.   Sleeping ok without nocturnal    exacerbation  of respiratory  c/o's or need for noct saba. Also denies any obvious fluctuation of symptoms with weather or environmental changes or other aggravating or alleviating factors except as outlined above   Current Medications, Allergies, Past Medical History, Past Surgical History, Family History, and Social History were reviewed in [employer] record.  ROS  The following are not active complaints unless bolded sore throat, dysphagia, dental problems, itching, sneezing,  nasal congestion or excess/ purulent secretions, ear ache,   fever, chills, sweats, unintended wt loss, pleuritic or exertional cp, hemoptysis,  orthopnea pnd or leg swelling, presyncope, palpitations, heartburn, abdominal pain, anorexia, nausea, vomiting, diarrhea  or change in bowel or  urinary habits, change in stools or urine, dysuria,hematuria,  rash, arthralgias, visual complaints, headache, numbness weakness or ataxia or problems with walking or coordination,  change in mood/affect or memory.               Objective:   Physical Exam  Very pleasant amb wf nad   .06/16/2013     146  Wt Readings from Last 3 Encounters:  03/16/13 145 lb (65.772 kg)  01/20/13 142 lb 9.6 oz (64.683 kg)  10/20/12 142 lb (64.411 kg)        HEENT: nl dentition, turbinates, and orophanx.  Nl external ear canals without cough reflex   NECK :  without JVD/Nodes/TM/ nl carotid upstrokes bilaterally   LUNGS: no acc muscle use, clear to A and P bilaterally   CV:  RRR  no s3 or murmur or increase in P2, no edema   ABD:  soft and nontender with nl excursion in the supine position. No bruits or organomegaly, bowel sounds nl  MS:  warm without deformities, calf tenderness, cyanosis or clubbing  SKIN: warm and dry without lesions     CXR  03/16/2013 :   No active cardiopulmonary disease.             Assessment & Plan:

## 2013-06-16 NOTE — Patient Instructions (Addendum)
Ok to continue tramadol as long as you don't find you start needing more and more   Instead of zyrtec next time you itching sneezing runny nose that you think is contributing to your cough > chlortrimeton 4 mg up to one every 4 hours    If you are satisfied with your treatment plan let your doctor know and he/she can either refill your medications or you can return here when your prescription runs out.     If in any way you are not 100% satisfied,  please tell us.  If 100% better, tell your friends!

## 2013-06-17 NOTE — Assessment & Plan Note (Signed)
-   PFT's 03/11/12 wnl - Sinus CT 05/27/12 > Pos  L sphenoid sinusitis with a/f level > resolved  10/09/12   Most c/w  Classic Upper airway cough syndrome, so named because it's frequently impossible to sort out how much is  CR/sinusitis with freq throat clearing (which can be related to primary GERD)   vs  causing  secondary (" extra esophageal")  GERD from wide swings in gastric pressure that occur with throat clearing, often  promoting self use of mint and menthol lozenges that reduce the lower esophageal sphincter tone and exacerbate the problem further in a cyclical fashion.   These are the same pts (now being labeled as having "irritable larynx syndrome" by some cough centers) who not infrequently have a history of having failed to tolerate ace inhibitors,  dry powder inhalers or biphosphonates or report having atypical reflux symptoms that don't respond to standard doses of PPI , and are easily confused as having aecopd or asthma flares by even experienced allergists/ pulmonologists.  Since only now requiring one tramadol a day I'm ok with using this as an unusual "maint" rx as long as no crescendo in symptoms or need for it  In meantime needs to try 1st antihistamines per guidelines for pnds  Pulmonary f/u can be prn refills or they can be provided by primary care at [facility] discretion

## 2013-07-05 ENCOUNTER — Encounter: Payer: Self-pay | Admitting: Orthopedic Surgery

## 2013-07-31 ENCOUNTER — Ambulatory Visit: Payer: Self-pay | Admitting: Oncology

## 2013-08-11 ENCOUNTER — Encounter: Payer: Self-pay | Admitting: Orthopedic Surgery

## 2013-08-11 ENCOUNTER — Ambulatory Visit: Payer: Self-pay | Admitting: Oncology

## 2013-08-19 IMAGING — CT CT PARANASAL SINUSES W/O CM
1 of 2 series · 13 of 30 positions shown, 17 images · non-contrast
Comparison: none

REASON FOR EXAM: Chronic  cough  sinusitis
COMMENTS:

PROCEDURE:     KCT - KCT SINUSES WITHOUT CONTRAST  - October 09, 2012  [DATE]
RESULT:     Comparison is made to a prior study dated 04/18/2012.
TECHNIQUE: Multiplanar imaging of the paranasal sinuses was obtained
utilizing helical acquisition and bone reconstruction algorithm.

[Series 2: axial · axial · 0.27mm/px · z∈[-48,+80]mm · 13 of 150 slices shown, 17 images]
[im 11/150  brain]
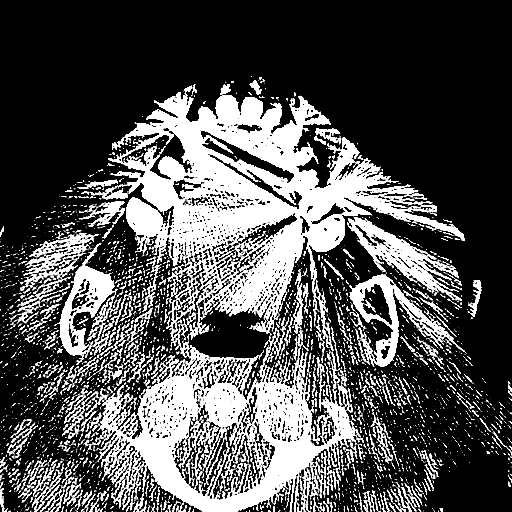
[im 11/150  bone]
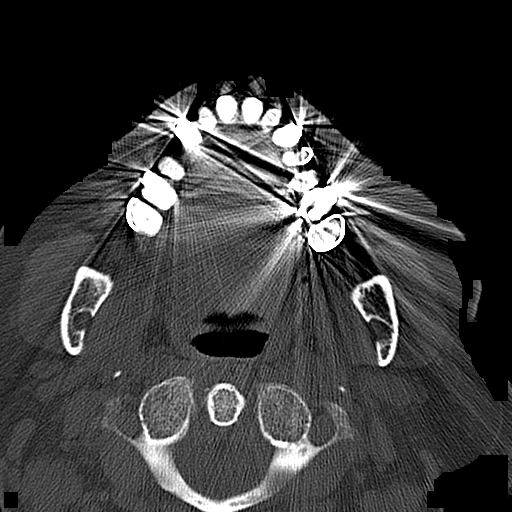
[im 22/150  bone]
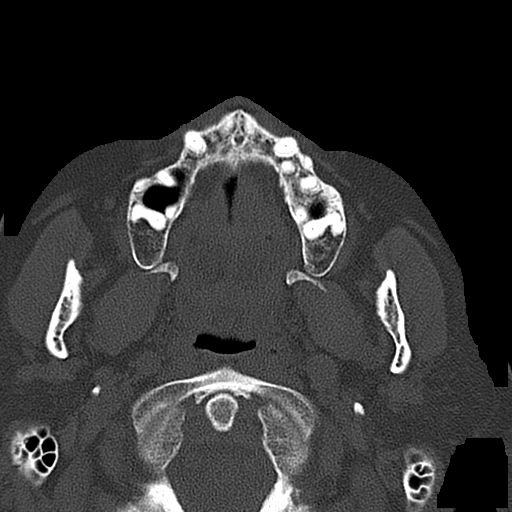
[im 32/150  bone]
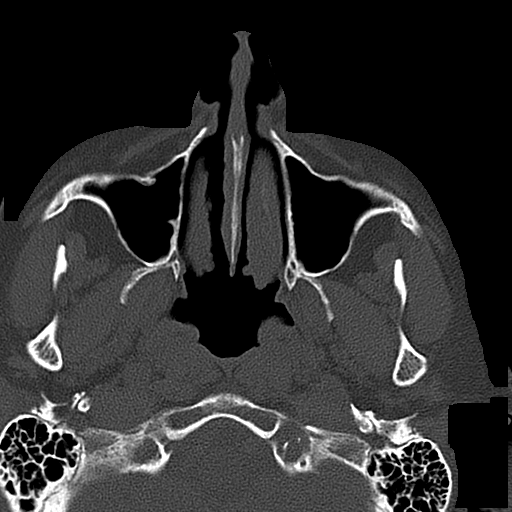
[im 43/150  bone]
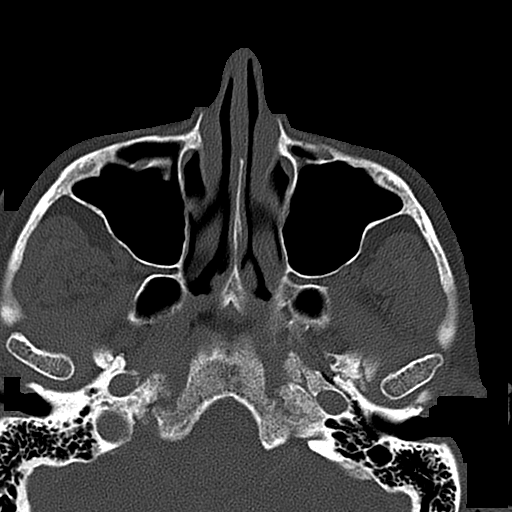
[im 54/150  brain]
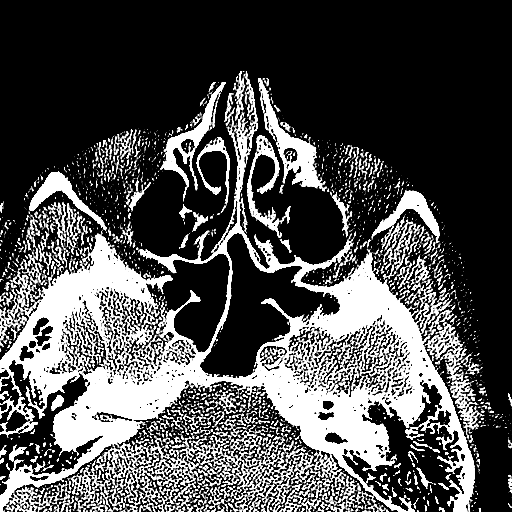
[im 54/150  bone]
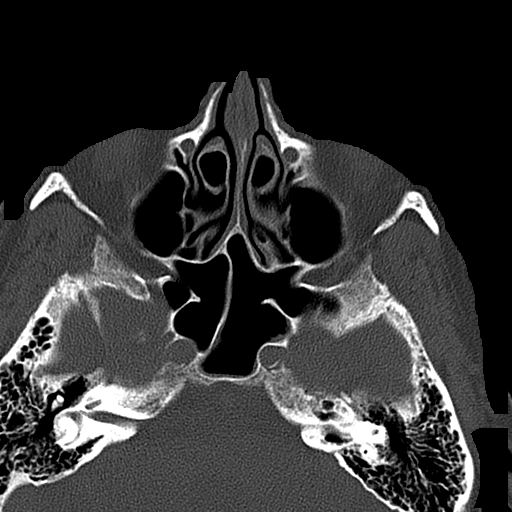
[im 64/150  bone]
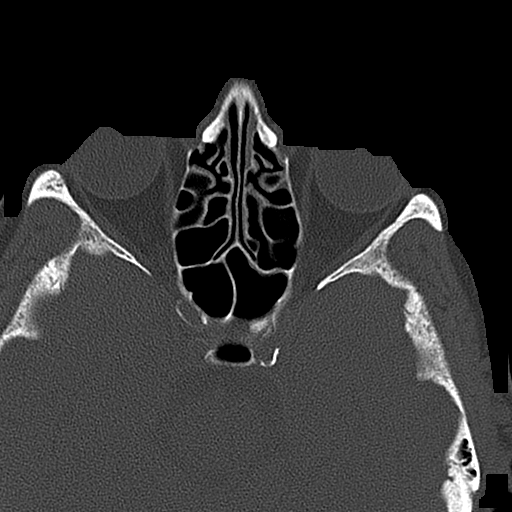
[im 75/150  bone]
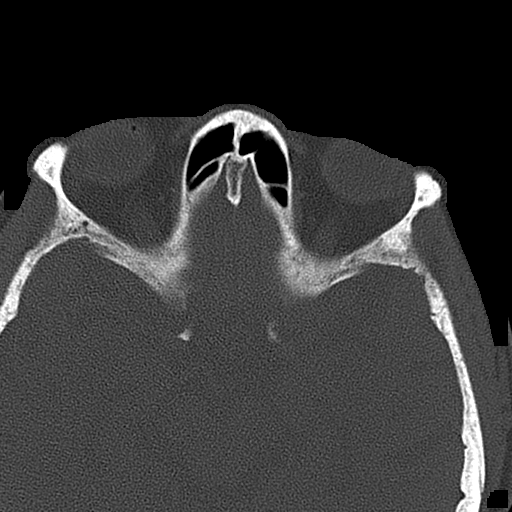
[im 86/150  bone]
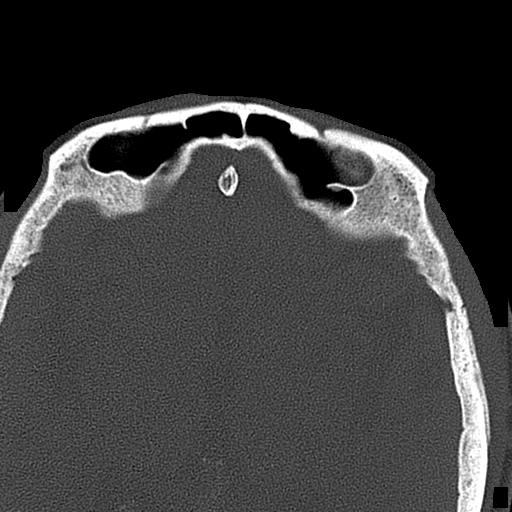
[im 96/150  brain]
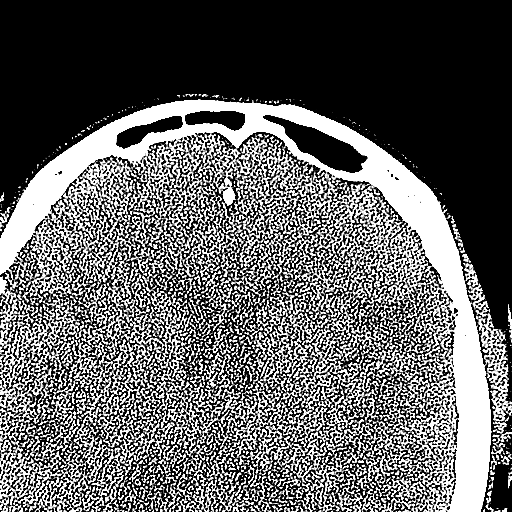
[im 96/150  bone]
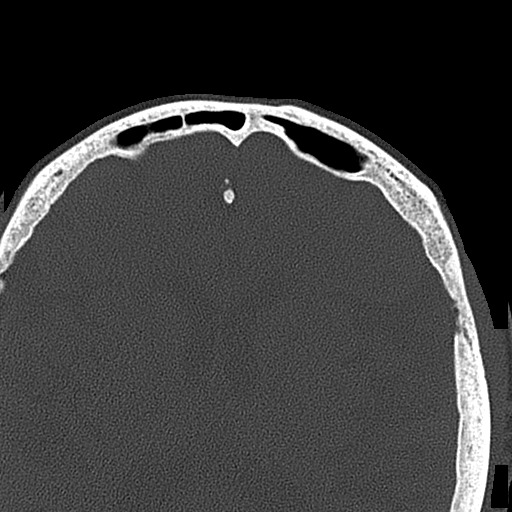
[im 107/150  bone]
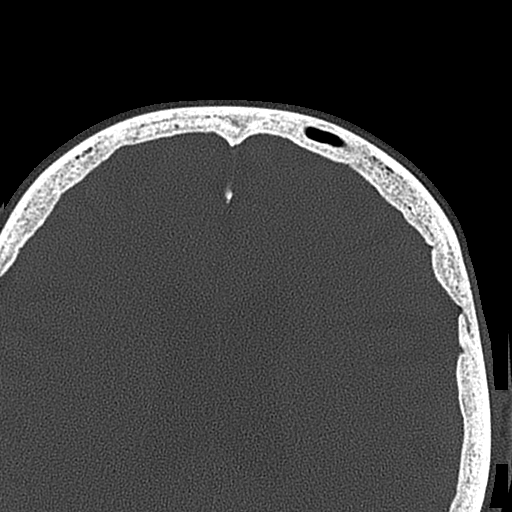
[im 118/150  bone]
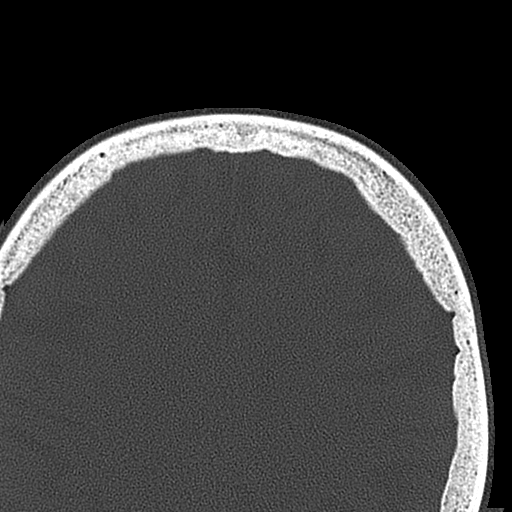
[im 128/150  bone]
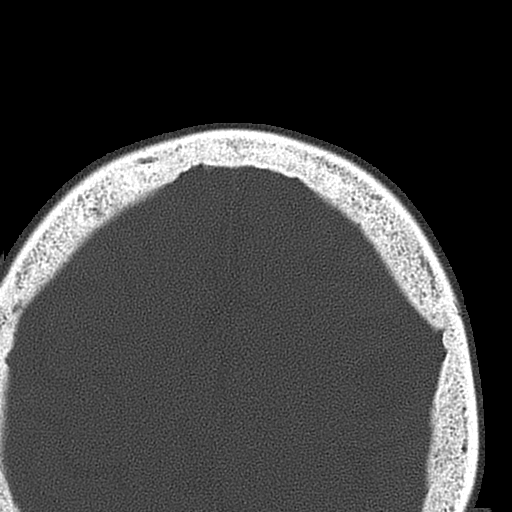
[im 139/150  brain]
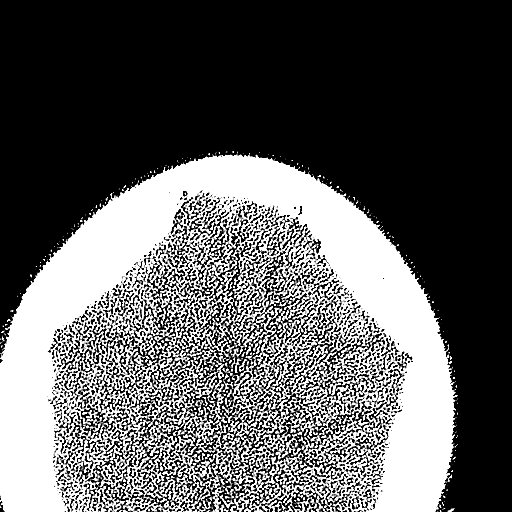
[im 139/150  bone]
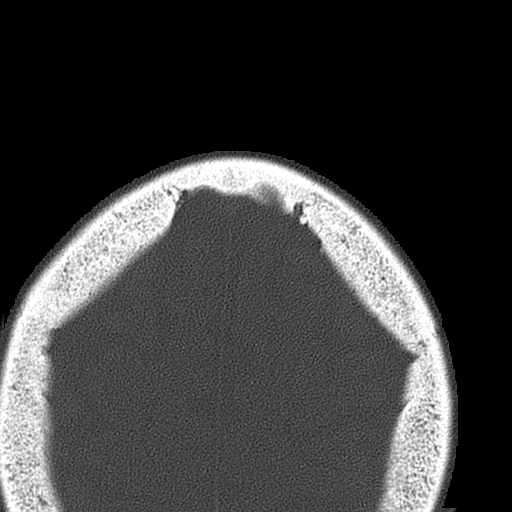

[13 of 30 positions shown; findings below may reference images not displayed]

FINDINGS: Evaluation of the paranasal sinuses demonstrates no evidence of
mucosal thickening or air-fluid levels. The ostiomeatal complexes
demonstrate concha bullosa within the middle and superior turbinates.
Complexes are otherwise unremarkable. The mastoid air cells are
unremarkable.
IMPRESSION: Unremarkable paranasal sinus CT. No CT evidence reflecting
sinusitis.

## 2013-08-30 ENCOUNTER — Emergency Department: Payer: Self-pay | Admitting: Emergency Medicine

## 2013-09-04 ENCOUNTER — Encounter: Payer: Self-pay | Admitting: Orthopedic Surgery

## 2014-04-26 ENCOUNTER — Encounter: Payer: Self-pay | Admitting: Neurology

## 2014-05-07 ENCOUNTER — Encounter: Payer: Self-pay | Admitting: Neurology

## 2014-06-03 ENCOUNTER — Ambulatory Visit: Payer: Self-pay

## 2014-06-07 ENCOUNTER — Encounter: Payer: Self-pay | Admitting: Neurology

## 2014-07-06 ENCOUNTER — Encounter: Payer: Self-pay | Admitting: Neurology

## 2014-08-29 NOTE — Consult Note (Signed)
PATIENT NAME:  Tanya Montgomery, Tanya Montgomery MR#:  [id number] DATE OF BIRTH:  [birth year]  DATE OF CONSULTATION:  08/25/2011  REFERRING PHYSICIAN:  Dr. Gilford Rile  CONSULTING PHYSICIAN:  Jahaira Earnhart D. Clayborn Bigness, MD  CARDIOLOGIST: Bartholome Bill, MD   INDICATION: Angina, chest pain, tachycardia.   HISTORY OF PRESENT ILLNESS: Tanya Montgomery is a 74 year old female with history of obstructive pulmonary disease, coronary artery disease, myocardial infarction, angioplasty and stent to the LAD, hyperlipidemia, hypertension, diabetes, history of breast cancer, obstructive sleep apnea unable to tolerate BiPAP, history of cerebrovascular accident in the past. She states she was doing okay, started having palpitations, tachycardia, and anginal chest pain. Chest pain was left-sided, sharp chest pain, then had some in her left arm. She had some shortness of breath and nausea. Pain was similar to when she'd had a heart attack and stent done in the past. She had been reasonably doing well up until this point She had had a stress test but that was many years ago. She had a catheterization that was many years ago as well. No recent cardiac work-up. The patient also has suffered from flulike symptoms for the last week or so. Has history of chronic obstructive pulmonary disease. Some mild phlegm production, which is chronic and has not worsened. She has been on amoxicillin on has almost completed 10 days. She had some wheezing and chest tightness which is chronic for her. She took some nitroglycerin and did not improve her symptoms, 325 aspirin as well. She had some nausea. No abdominal pain. Minimal reflux symptoms. She was admitted and placed on anti-coagulation.   REVIEW OF SYSTEMS: No blackout spells. No syncope. No nausea or vomiting. No fever, no chills, no sweats, or weight loss. No weight gain, hemoptysis, hematemesis. Denies bright red blood rectum.   PAST MEDICAL HISTORY:  1. Chronic obstructive pulmonary disease. 2. Coronary  disease. 3. Hyperlipidemia. 4. Hypertension.  5. Obesity.  6. Obstructive sleep apnea.  7. Cerebrovascular accident. 8. Breast cancer.   PAST SURGICAL HISTORY:  1. Cardiac catheterization. 2. Angioplasty and stent to LAD.   3. Parathyroidectomy. 4. Status post breast cancer resection.  5. Mastectomy. 6. Laparoscopic cholecystectomy. 7. Hysterectomy.   ALLERGIES TO MEDICATIONS: She says tape, Vytorin, pravastatin. Is able to tolerate Lipitor and possible ACE inhibitors.   MEDICATIONS:  1. Amlodipine 5 mg a day.  2. Aggrenox twice a day.  3. Atorvastatin 10 a day.  4. Bupropion 150 daily.  5. Calcium with Vitamin D twice a day.  6. Cetirizine 10 mg a day.  7. Digoxin 0.25 Tuesday, Thursday, Saturday; half a pill on Sunday, Monday, Wednesday, Friday.  8. Diovan 160 at bedtime.  9. DuoNebs q.6 p.r.n.  10. Glipizide XL 10 a day.  11. Imdur 45 mg twice a day.  12. Exemestane 25 mg at bedtime. 13. Klor-Con 10 mEq three times a day.  14. Metformin 1000 mg twice a day.  15. Metoprolol 50 twice a day.   16. Multivitamin half tablet twice a day.  17. Nexium 40 twice a day.  18. Primidone six tablets at bedtime, 50 mg for a total of 300 daily.  19. Spironolactone 25 a day.  20. Torsemide 20 mg 2 tablets in the morning. 21. Tussionex p.r.n.  22. Venlafaxine 75 twice a day.  23. Lantus 50 mg at bedtime.  24. Nasonex nasal spray as needed. 25. Again, she is on amoxicillin to complete a 7 to 10 day course.   FAMILY HISTORY: Brain cancer, bipolar disorder, congestive heart failure,  thyroid cancer.   SOCIAL HISTORY: Lives with her husband. No smoking but secondhand smoke.   PHYSICAL EXAMINATION:   VITAL SIGNS: Blood pressure 140/90, pulse 110 and regular, respiratory rate 20, afebrile, 92% on 2 liters.   HEENT: Normocephalic, atraumatic. Pupils equal, reactive to light.   NECK: Supple. No JVD, bruits, or adenopathy.   LUNGS: Clear to auscultation and percussion. No significant  wheeze, rhonchi, or rales.  HEART: Regular rate and rhythm. No murmurs, gallops, or rubs.    ABDOMEN: Benign.   EXTREMITIES: Within normal limits.   NEUROLOGIC: Intact.   SKIN: Normal.   LABORATORY, DIAGNOSTIC, AND RADIOLOGICAL DATA: White count 7.6, hemoglobin 13.4, platelet count 219, sodium 138, potassium 4, BUN 20, creatinine 0.8. LFTs normal. CK 96. Troponin 0.03.   EKG sinus tachycardia at about 100, mild ST segment depression in 1 and L, incomplete right bundle branch block, nonspecific ST-T wave changes.   ASSESSMENT:  1. Angina, possible unstable angina.  2. Chest pain. 3. Known coronary disease.  4. Hypertension.  5. Hyperlipidemia.  6. Chronic obstructive pulmonary disease.  7. History of cerebrovascular accident. 8. History of breast cancer.  9. Gastroesophageal reflux disease.   PLAN:  1. Agree with admit.  2. Rule out for myocardial infarction. 3. Follow-up EKG.  4. Because of her multiple risk factors and recent symptoms, would strongly consider cardiac cath at this point and base further procedures on Dr. Ubaldo Glassing. I will discuss the case with him. For now will consider cath to further evaluate the patient's coronary anatomy. She has had angioplasty and stenting years ago.  5. Continue current medications.  6. Continue Imdur. 7. Follow-up EKG. 8. Continue hypertension control.  9. Continue lipid management.  10. Recommend weight loss and exercise.  11. Aspirin therapy.  12. Continue telemetry to evaluate for possible tachycardia. 13. Continue inhalers.  ____________________________ Loran Senters Clayborn Bigness, MD ddc:drc D: 08/26/2011 12:53:00 ET T: 08/26/2011 13:39:59 ET JOB#: [id number]  cc: Diamonds Lippard D. Clayborn Bigness, MD, <Dictator> Yolonda Kida MD ELECTRONICALLY SIGNED 09/12/2011 22:50

## 2014-08-29 NOTE — Discharge Summary (Signed)
PATIENT NAME:  Tanya Montgomery, Tanya Montgomery MR#:  [id number] DATE OF BIRTH:  October 28, 1940  DATE OF ADMISSION:  08/24/2011 DATE OF DISCHARGE:  08/27/2011  HISTORY OF PRESENT ILLNESS: Ms. Lubinski was a 74 year old white lady with known coronary artery disease who presented to the emergency room complaining of palpitations and atypical chest pain. The patient was therefore admitted for further evaluation.   PAST MEDICAL HISTORY:  1. Coronary artery disease with previous a stent to the LAD, which was done at [facility]. Her last stress test had been in October  of 2011 which showed no evidence of ischemia and a normal ejection fraction.  2. History of hypertension.  3. Hyperlipidemia.  4. Diabetes. 5. Obstructive sleep apnea.  6. Her last echocardiogram had been done in April of 2011, which showed a normal ejection fraction, but mild left ventricular hypertrophy and possible diastolic failure.  7. History of breast cancer status post left mastectomy.  8. History of gastroesophageal reflux disease.  9. Her last admission was in April 2011 with an acute cerebrovascular accident from which she was left with very little residual.   ALLERGIES: The patient was noted to be intolerant of Vytorin and pravastatin. There was also a question of cough with ACE inhibitors.   MEDICATIONS ON ADMISSION:  1. Amlodipine 5 mg daily.  2. Aggrenox 1 capsule twice a day. 3. Lipitor 10 mg daily.  4. Bupropion 150 mg daily.  5. Calcium with vitamin D twice daily. 6. Claritin 10 mg daily.  7. Digoxin 0.25 mg on Tuesday, Thursday, Saturday, and Sunday and 1/2 tablet on Monday, Wednesday, and Friday. 8. Digoxin 160 mg daily. 9. DuoNebs every six hours as needed.  10. Glipizide XL 10 mg daily.  11. Isosorbide 30 mg daily. 12. Exemestane 25 mg daily.  13. Klor-Con 10 mEq on Monday, Wednesday, and Friday. 14. Metformin 1000 mg twice a day. 15. Metoprolol 50 mg twice a day. 16. Nexium 40 mg twice a day. 17. Primidone 50  mg six tablets at bedtime.  18. Spironolactone 25 mg at bedtime. 19. Torsemide 20 mg 2 tablets daily.  20. Effexor 75 mg twice a day. 21. Lantus insulin 50 mg at bedtime. 22. Nasonex nasal spray 2 puffs each nostril daily.   PHYSICAL EXAMINATION: Admission vital signs included a temperature of 96.7, heart rate 111, respiratory rate 20, blood pressure 132/96, and a pulse oximetry of 92% on 2 liters. Examination as described by the admitting physician was notable only for her irregular tachycardia on heart examination.   HOSPITAL COURSE: The patient was admitted to the regular medical floor where she was placed on telemetry. Serial cardiac enzymes were obtained which were relatively unremarkable. She was seen in consultation by her cardiologist and eventually was taken to cardiac catheterization which showed no change in her native anatomy. Continued medical management was recommended. It was noted that prior to admission the patient had had a recent URI and it was felt that some of her symptoms may have been related to that.   DISCHARGE DIAGNOSIS: Noncardiac chest pain.   PROCEDURE: Cardiac catheterization.          DISCHARGE DISPOSITION: The patient was discharged on a low sodium, diabetic diet. She is to have activity as tolerated. No changes were made in her preadmission medications. The patient is to be followed up in the office in 2 to 4 weeks.  ____________________________ Hewitt Blade Sarina Ser, MD jbw:slb D: 09/10/2011 05:54:02 ET T: 09/10/2011 07:00:56 ET JOB#: [id number]  cc: Jenny Reichmann  Charlesetta Shanks, MD, <Dictator> Lottie Mussel III MD ELECTRONICALLY SIGNED 09/10/2011 20:17

## 2014-08-29 NOTE — H&P (Signed)
PATIENT NAME:  Tanya Montgomery, Tanya Montgomery MR#:  [id number] DATE OF BIRTH:  Jan 11, 1941  DATE OF ADMISSION:  08/24/2011  ADDENDUM:   Discussed with Cardiology, Dr. Clayborn Bigness covering for Dr. Ubaldo Glassing.  Considering that the patient had disease in the past and had multiple risk factors, he will see her in the morning. I have canceled her stress test for tomorrow. The patient may need a catheterization or the stress test but Cardiology will decide that.     ____________________________ Mena Pauls, MD ag:vtd D: 08/24/2011 20:02:06 ET T: 08/25/2011 07:53:23 ET JOB#: [id number]  cc: Mena Pauls, MD, <Dictator> Mena Pauls MD ELECTRONICALLY SIGNED 09/15/2011 14:21

## 2014-08-29 NOTE — H&P (Signed)
PATIENT NAME:  Tanya, Montgomery MR#:  [id number] DATE OF BIRTH:  1941/04/15  DATE OF ADMISSION:  08/24/2011  PRIMARY CARE PHYSICIAN: Dr. Lisette Grinder.   CHIEF COMPLAINT: "Chest pain and my heart beating fast."   HISTORY OF PRESENT ILLNESS: A 74 year old female who has history of chronic obstructive pulmonary disease, not on home oxygen. She also has history of coronary artery disease with previous LAD stent placement, hypertension, hyperlipidemia, diabetes, history of breast cancer, obstructive sleep apnea, cannot use CPAP, history of cerebrovascular accident. Today she said , around in the afternoon she started having palpitations, and she was complaining of some chest pain. The chest pain was left-sided.  It was first like a sharp kind of chest pain, and then she had some pain in the left arm also.  She was complaining of some shortness of breath, some nausea. She said she never had chest pains in the past. When she had the stent placement she had abnormal stress test which was followed by abnormal catheterization and they did a stent placement on her.   She has also been suffering from cold-like symptoms for about like 7 to 10 days now. She has chronic obstructive pulmonary disease, and she is having a cough with phlegm production which is chronic for her. She has been on amoxicillin, and she almost completed about 10 days of amoxicillin. She has some wheezing, chest tightness, she says, which is kind of like chronic for her. She took nitroglycerin for the chest pain but that did not improve the chest pain so she took a second dose of nitroglycerin. Again it did not improve the chest pain so they called EMS.  They also took about 325 mg of aspirin at home; the husband gave it to her. She also had some nausea. She denies any abdominal pain. She says the chest pain was not similar to her GERD-like symptoms. She denies any local chest wall tenderness when she mashes it. So she had received a dose of  Lovenox in the emergency room, and hospitalist was asked to admit the patient because of the chest pain.    REVIEW OF SYSTEMS: She denies any fever. Denies any weakness. No acute change in vision. No headache. She has cough. She was complaining of some wheeze. She has chronic obstructive pulmonary disease. She also complains of chest pain and palpitations but no syncope. Some mild nausea but no abdominal pain, no GI bleed. She said she had dark stools.  No dysuria. No frequency. No thyroid problems. No anemia.  No rash. No joint pains or swelling.  No focal numbness or weakness.   PAST MEDICAL HISTORY: COPD. She has coronary artery disease with stent to LAD at [facility]. She had a cardiac catheterization in May 2003 which shows that she had a patent stent to LAD, 20% stenosis of RCA and 30% left circumflex stenosis.  Her last stress test was done in October of 2011 which showed that the patient had no evidence of ischemia, normal ejection fraction of 52%. She has hypertensin, hyperlipidemia, diabetes, obstructive sleep apnea, not using CPAP. She has chronic congestive heart failure. Her last echocardiogram in April of 2011 was suggestive of EF of greater than 55%, mild LVH, maybe chronic diastolic failure. History of breast cancer, status post left mastectomy.  Gastroesophageal reflux disease. She was last admitted in April of 2011 because of acute cerebrovascular accident.   PAST SURGICAL HISTORY:  A stent placement in LAD, status post parathyroidectomy, status post breast  cancer resection, mastectomy, status post laparoscopic cholecystectomy, and status post hysterectomy.   ALLERGIES TO MEDICATIONS:  TAPE.  SHE HAS SOME ALLERGIES TO VYTORIN AND PRAVASTATIN BUT SHE TAKES LIPITOR, AND SHE MAYBE HAS A COUGH TO ACE INHIBITORS.    HOME MEDICATIONS: Which have been reviewed with the patient include:  1. Amlodipine 5 mg daily.  2. Aggrenox twice a day.   3. Atorvastatin 10 mg daily.    4. Bupropion 150 mg daily.  5. Calcium with vitamin D twice a day.  6. Cetirizine 10 mg daily.   7. Digoxin, she takes 0.25 mg on Tuesday, Thursday, Saturday, 2022/09/01 and half of 0.25 mg (0.125 mg) on Monday, Wednesday, and Friday.  8. She takes Diovan 160 mg at bedtime.   9. DuoNeb q.6 hours as needed.   10. Glipizide XL 10 mg daily.  11. She takes isosorbide dinitrate 30 mg.  She takes 45 mg twice a day.  12. She takes exemestane 25 mg at bedtime.  13. She takes Klor-Con 10 mEq Monday, Wednesday and Friday.   14. Metformin 1000 mg b.i.d.  15. Metoprolol 50 mg b.i.d.  16. Multivitamin 1/2 tablet b.i.d.  17. Nexium 40 mg b.i.d.  18. Primidone 6 tablets at bedtime, 50 mg, total of 300 mg.   19. Spironolactone 25 mg at bedtime.   20. Torsemide 20 mg 2 tablets in the morning.   21. Tussionex p.r.n.  22. Venlafaxine 75 mg b.i.d.  23. Lantus 50 units at bedtime.   24. Nasonex nasal spray.  25. She has just completed amoxicillin.   SOCIAL HISTORY: She lives with her husband. She has never smoked but exposed to secondhand smoke. No alcohol use. Her previous ex-husband died from renal failure in 2008/08/31.    FAMILY HISTORY:  Son died at the age of 5 of brain cancer. Daughter had bipolar disorder. Mother died of congestive heart failure. Father died of cancer of the thyroid with metastasis.   PHYSICAL EXAMINATION:  VITAL SIGNS: When she presented, temperature of 96.7, heart rate of 111, respiratory rate 20, blood pressure 132/96, saturating 92% on 2L nasal cannula. Currently heart rate is 130, blood pressure 137/87.    GENERAL:  This is an elderly Caucasian female not in acute distress. She is still complaining of mild chest pressure but that has significantly improved. Well built.   HEENT: Bilateral pupils are equal. Extraocular muscles intact. No scleral icterus. No conjunctivitis. Oral mucosa is moist. No pallor.   NECK: No thyroid tenderness, enlargement, nodule. Neck is supple. No masses,  nontender. No adenopathy. No JVD. No carotid bruit.   CHEST: Bilateral breath sounds are clear. She has some coughing spells going on but normal respiratory effort, not using accessory muscles of respiration.   HEART: Heart sounds are regular and tachycardic. No murmur. Good peripheral pulses. No lower extremity edema.   ABDOMEN: Abdomen appears to be soft, nontender. Normal bowel sounds. No hepatosplenomegaly.  No bruit. No masses.   RECTAL: Deferred.   NEUROLOGIC: She is awake, alert, oriented to time, place, and person. Good insight. Cranial nerves are intact. Moving all extremities against gravity.  No cyanosis, no clubbing.   SKIN: No rash. No lesions.   LABORATORY DATA:  Lab work shows that she has a white count of 7.6, hemoglobin of 13.4, platelet count 219,000. BMP: Sodium of 138, potassium of 4, BUN of 20, creatinine of 0.8. LFTs are normal. CK of 96, troponin was 0.03. Her EKG shows sinus tachycardia at 106.  She has  some mild ST depressions in I and aVL. She has an incomplete right bundle branch block and nonspecific T wave changes.   IMPRESSION:  1. Chest pain, with atypical and typical features, unstable angina with underlying coronary artery disease.  We will rule out myocardial infarction.  2. History of coronary artery disease with previous stent placement.  3. Chronic obstructive pulmonary disease, stable at this time.  4. Previous history of cerebrovascular accident, diabetes, hypertension, hyperlipidemia, chronic diastolic failure, compensated at this time, history of breast cancer, and gastroesophageal reflux disease.   PLAN: A 74 year old female who has coronary artery disease with previous stent to the LAD, chronic obstructive pulmonary disease, obstructive sleep apnea, diabetes, hypertension, hyperlipidemia. Her last stress test was done in 2011 but did not show any reversible ischemia. Today she presented with some palpitations and chest pain radiating to her left arm  associated with some shortness of breath. This was nonexertional, did not improve with nitroglycerin. Her EKG showed some mild ST changes in the lateral leads, not very significant.  She had some tachycardia. She already got aspirin at home, 325 mg aspirin.  Normally she takes Aggrenox. We will continue her metoprolol, continue her statin. We will put her on nitroglycerin paste and we will continue Lovenox on her.  We will do serial cardiac enzymes on her, call a cardiology consult on her. She may need to be stressed again. We will continue her digoxin, Aldactone, and torsemide for her congestive heart failure . We will continue her Norvasc, metoprolol, and Diovan for her hypertension. Continue her DuoNeb for her COPD, and we will give her Tessalon Perles p.r.n. for cough. We will hold her metformin right now in case she needs cardiac catheterization. We will try to reach her cardiologist, Dr. Ubaldo Glassing. Patient will be transferred to Dr. Thomes Dinning service.          TIME SPENT WITH ADMISSION:  40 minutes.     ____________________________ Mena Pauls, MD ag:vtd D: 08/24/2011 19:29:12 ET T: 08/25/2011 07:15:31 ET JOB#: [id number]  cc: Mena Pauls, MD, <Dictator> Hendricks Sarina Ser, MD Mena Pauls MD ELECTRONICALLY SIGNED 09/15/2011 14:21

## 2014-09-10 ENCOUNTER — Other Ambulatory Visit: Payer: Self-pay | Admitting: Internal Medicine

## 2014-09-10 DIAGNOSIS — R945 Abnormal results of liver function studies: Secondary | ICD-10-CM

## 2014-09-15 ENCOUNTER — Ambulatory Visit
Admission: RE | Admit: 2014-09-15 | Discharge: 2014-09-15 | Disposition: A | Discharge status: Home / Self Care | Payer: Medicare Other | Source: Ambulatory Visit | Attending: Internal Medicine | Admitting: Internal Medicine

## 2014-09-15 DIAGNOSIS — Z9049 Acquired absence of other specified parts of digestive tract: Secondary | ICD-10-CM | POA: Insufficient documentation

## 2014-09-15 DIAGNOSIS — R932 Abnormal findings on diagnostic imaging of liver and biliary tract: Secondary | ICD-10-CM | POA: Insufficient documentation

## 2014-09-15 DIAGNOSIS — R945 Abnormal results of liver function studies: Secondary | ICD-10-CM | POA: Diagnosis not present

## 2014-11-26 ENCOUNTER — Other Ambulatory Visit: Payer: Self-pay | Admitting: Internal Medicine

## 2014-12-24 ENCOUNTER — Inpatient Hospital Stay
Admission: EM | Admit: 2014-12-24 | Discharge: 2014-12-25 | DRG: 066 | Disposition: A | Discharge status: Home / Self Care | Payer: Medicare Other | Attending: Internal Medicine | Admitting: Internal Medicine

## 2014-12-24 ENCOUNTER — Encounter: Payer: Self-pay | Admitting: Emergency Medicine

## 2014-12-24 ENCOUNTER — Emergency Department: Payer: Medicare Other

## 2014-12-24 DIAGNOSIS — Z9010 Acquired absence of unspecified breast and nipple: Secondary | ICD-10-CM | POA: Diagnosis present

## 2014-12-24 DIAGNOSIS — Z794 Long term (current) use of insulin: Secondary | ICD-10-CM | POA: Diagnosis not present

## 2014-12-24 DIAGNOSIS — R4781 Slurred speech: Secondary | ICD-10-CM

## 2014-12-24 DIAGNOSIS — R011 Cardiac murmur, unspecified: Secondary | ICD-10-CM | POA: Diagnosis present

## 2014-12-24 DIAGNOSIS — Z66 Do not resuscitate: Secondary | ICD-10-CM | POA: Diagnosis present

## 2014-12-24 DIAGNOSIS — Z599 Problem related to housing and economic circumstances, unspecified: Secondary | ICD-10-CM | POA: Diagnosis not present

## 2014-12-24 DIAGNOSIS — I63511 Cerebral infarction due to unspecified occlusion or stenosis of right middle cerebral artery: Secondary | ICD-10-CM | POA: Diagnosis not present

## 2014-12-24 DIAGNOSIS — N189 Chronic kidney disease, unspecified: Secondary | ICD-10-CM | POA: Diagnosis present

## 2014-12-24 DIAGNOSIS — Z801 Family history of malignant neoplasm of trachea, bronchus and lung: Secondary | ICD-10-CM

## 2014-12-24 DIAGNOSIS — Z888 Allergy status to other drugs, medicaments and biological substances status: Secondary | ICD-10-CM | POA: Diagnosis not present

## 2014-12-24 DIAGNOSIS — E119 Type 2 diabetes mellitus without complications: Secondary | ICD-10-CM | POA: Diagnosis present

## 2014-12-24 DIAGNOSIS — Z808 Family history of malignant neoplasm of other organs or systems: Secondary | ICD-10-CM

## 2014-12-24 DIAGNOSIS — K219 Gastro-esophageal reflux disease without esophagitis: Secondary | ICD-10-CM | POA: Diagnosis present

## 2014-12-24 DIAGNOSIS — E780 Pure hypercholesterolemia: Secondary | ICD-10-CM | POA: Diagnosis present

## 2014-12-24 DIAGNOSIS — Z825 Family history of asthma and other chronic lower respiratory diseases: Secondary | ICD-10-CM

## 2014-12-24 DIAGNOSIS — Z9889 Other specified postprocedural states: Secondary | ICD-10-CM | POA: Diagnosis not present

## 2014-12-24 DIAGNOSIS — Z9049 Acquired absence of other specified parts of digestive tract: Secondary | ICD-10-CM | POA: Diagnosis present

## 2014-12-24 DIAGNOSIS — G4730 Sleep apnea, unspecified: Secondary | ICD-10-CM | POA: Diagnosis present

## 2014-12-24 DIAGNOSIS — Z7982 Long term (current) use of aspirin: Secondary | ICD-10-CM | POA: Diagnosis not present

## 2014-12-24 DIAGNOSIS — I63411 Cerebral infarction due to embolism of right middle cerebral artery: Secondary | ICD-10-CM | POA: Diagnosis not present

## 2014-12-24 DIAGNOSIS — I509 Heart failure, unspecified: Secondary | ICD-10-CM | POA: Diagnosis present

## 2014-12-24 DIAGNOSIS — Z853 Personal history of malignant neoplasm of breast: Secondary | ICD-10-CM

## 2014-12-24 DIAGNOSIS — I4891 Unspecified atrial fibrillation: Secondary | ICD-10-CM | POA: Diagnosis present

## 2014-12-24 DIAGNOSIS — J45909 Unspecified asthma, uncomplicated: Secondary | ICD-10-CM | POA: Diagnosis present

## 2014-12-24 DIAGNOSIS — J449 Chronic obstructive pulmonary disease, unspecified: Secondary | ICD-10-CM | POA: Diagnosis present

## 2014-12-24 DIAGNOSIS — I252 Old myocardial infarction: Secondary | ICD-10-CM | POA: Diagnosis not present

## 2014-12-24 DIAGNOSIS — Z79899 Other long term (current) drug therapy: Secondary | ICD-10-CM

## 2014-12-24 DIAGNOSIS — I129 Hypertensive chronic kidney disease with stage 1 through stage 4 chronic kidney disease, or unspecified chronic kidney disease: Secondary | ICD-10-CM | POA: Diagnosis present

## 2014-12-24 DIAGNOSIS — Z90710 Acquired absence of both cervix and uterus: Secondary | ICD-10-CM

## 2014-12-24 DIAGNOSIS — Z8249 Family history of ischemic heart disease and other diseases of the circulatory system: Secondary | ICD-10-CM | POA: Diagnosis not present

## 2014-12-24 DIAGNOSIS — Z9861 Coronary angioplasty status: Secondary | ICD-10-CM

## 2014-12-24 DIAGNOSIS — Z8673 Personal history of transient ischemic attack (TIA), and cerebral infarction without residual deficits: Secondary | ICD-10-CM | POA: Diagnosis not present

## 2014-12-24 DIAGNOSIS — I2510 Atherosclerotic heart disease of native coronary artery without angina pectoris: Secondary | ICD-10-CM | POA: Diagnosis present

## 2014-12-24 DIAGNOSIS — I639 Cerebral infarction, unspecified: Secondary | ICD-10-CM

## 2014-12-24 LAB — COMPREHENSIVE METABOLIC PANEL
ALBUMIN: 3.8 g/dL (ref 3.5–5.0)
ALK PHOS: 78 U/L (ref 38–126)
ALT: 68 U/L — ABNORMAL HIGH (ref 14–54)
ANION GAP: 12 (ref 5–15)
AST: 55 U/L — ABNORMAL HIGH (ref 15–41)
BUN: 32 mg/dL — ABNORMAL HIGH (ref 6–20)
CALCIUM: 9.4 mg/dL (ref 8.9–10.3)
CO2: 27 mmol/L (ref 22–32)
Chloride: 94 mmol/L — ABNORMAL LOW (ref 101–111)
Creatinine, Ser: 1.48 mg/dL — ABNORMAL HIGH (ref 0.44–1.00)
GFR calc non Af Amer: 33 mL/min — ABNORMAL LOW (ref >60)
GFR, EST AFRICAN AMERICAN: 39 mL/min — AB (ref >60)
GLUCOSE: 255 mg/dL — AB (ref 65–99)
POTASSIUM: 4.8 mmol/L (ref 3.5–5.1)
SODIUM: 133 mmol/L — AB (ref 135–145)
Total Bilirubin: 0.6 mg/dL (ref 0.3–1.2)
Total Protein: 7.2 g/dL (ref 6.5–8.1)

## 2014-12-24 LAB — CBC WITH DIFFERENTIAL/PLATELET
BASOS PCT: 1 %
Basophils Absolute: 0.1 10*3/uL (ref 0–0.1)
EOS ABS: 0.1 10*3/uL (ref 0–0.7)
Eosinophils Relative: 1 %
HEMATOCRIT: 41.1 % (ref 35.0–47.0)
HEMOGLOBIN: 13.5 g/dL (ref 12.0–16.0)
LYMPHS ABS: 3.2 10*3/uL (ref 1.0–3.6)
Lymphocytes Relative: 34 %
MCH: 28.9 pg (ref 26.0–34.0)
MCHC: 32.9 g/dL (ref 32.0–36.0)
MCV: 88 fL (ref 80.0–100.0)
MONOS PCT: 9 %
Monocytes Absolute: 0.8 10*3/uL (ref 0.2–0.9)
NEUTROS ABS: 5.4 10*3/uL (ref 1.4–6.5)
NEUTROS PCT: 57 %
Platelets: 176 10*3/uL (ref 150–440)
RBC: 4.67 MIL/uL (ref 3.80–5.20)
RDW: 14.4 % (ref 11.5–14.5)
WBC: 9.5 10*3/uL (ref 3.6–11.0)

## 2014-12-24 LAB — TROPONIN I: Troponin I: <0.03 ng/mL (ref <0.031)

## 2014-12-24 LAB — URINALYSIS COMPLETE WITH MICROSCOPIC (ARMC ONLY)
Bacteria, UA: NONE SEEN
Bilirubin Urine: NEGATIVE
Glucose, UA: 50 mg/dL — AB
HGB URINE DIPSTICK: NEGATIVE
KETONES UR: NEGATIVE mg/dL
Leukocytes, UA: NEGATIVE
Nitrite: NEGATIVE
PH: 5 (ref 5.0–8.0)
PROTEIN: NEGATIVE mg/dL
RBC / HPF: NONE SEEN RBC/hpf (ref 0–5)
Specific Gravity, Urine: 1.012 (ref 1.005–1.030)

## 2014-12-24 LAB — GLUCOSE, CAPILLARY
Glucose-Capillary: 246 mg/dL — ABNORMAL HIGH (ref 65–99)
Glucose-Capillary: 202 mg/dL — ABNORMAL HIGH (ref 65–99)
Glucose-Capillary: 190 mg/dL — ABNORMAL HIGH (ref 65–99)

## 2014-12-24 MED ORDER — VALACYCLOVIR HCL 500 MG PO TABS
1000.0000 mg | ORAL_TABLET | Freq: Every day | ORAL | Status: DC
  Administered 2014-12-25: 1000 mg via ORAL
  Filled 2014-12-24: qty 2

## 2014-12-24 MED ORDER — CALCIUM CARBONATE-VITAMIN D 500-200 MG-UNIT PO TABS
1.0000 | ORAL_TABLET | Freq: Two times a day (BID) | ORAL | Status: DC
  Filled 2014-12-24: qty 1

## 2014-12-24 MED ORDER — DIGOXIN 125 MCG PO TABS: 0.1250 mg | ORAL_TABLET | Freq: Every day | ORAL | Status: DC

## 2014-12-24 MED ORDER — ADULT MULTIVITAMIN W/MINERALS CH: 1.0000 | ORAL_TABLET | Freq: Every day | ORAL | Status: DC

## 2014-12-24 MED ORDER — INSULIN ASPART 100 UNIT/ML ~~LOC~~ SOLN: 0.0000 [IU] | Freq: Three times a day (TID) | SUBCUTANEOUS | Status: DC

## 2014-12-24 MED ORDER — VENLAFAXINE HCL 75 MG PO TABS
75.0000 mg | ORAL_TABLET | Freq: Two times a day (BID) | ORAL | Status: DC
  Administered 2014-12-25: 12:00:00 75 mg via ORAL
  Filled 2014-12-24 (×3): qty 1

## 2014-12-24 MED ORDER — CLOPIDOGREL BISULFATE 75 MG PO TABS: 75.0000 mg | ORAL_TABLET | Freq: Every day | ORAL | Status: DC

## 2014-12-24 MED ORDER — DIGOXIN 125 MCG PO TABS: 0.1250 mg | ORAL_TABLET | ORAL | Status: DC

## 2014-12-24 MED ORDER — ISOSORBIDE DINITRATE 20 MG PO TABS
45.0000 mg | ORAL_TABLET | Freq: Two times a day (BID) | ORAL | Status: DC
  Filled 2014-12-24 (×3): qty 1

## 2014-12-24 MED ORDER — POTASSIUM CHLORIDE CRYS ER 10 MEQ PO TBCR
10.0000 meq | EXTENDED_RELEASE_TABLET | ORAL | Status: DC
  Administered 2014-12-25: 12:00:00 10 meq via ORAL
  Filled 2014-12-24: qty 1

## 2014-12-24 MED ORDER — DIGOXIN 125 MCG PO TABS: 0.2500 mg | ORAL_TABLET | ORAL | Status: DC

## 2014-12-24 MED ORDER — OMEGA-3-ACID ETHYL ESTERS 1 G PO CAPS
2.0000 g | ORAL_CAPSULE | Freq: Two times a day (BID) | ORAL | Status: DC
  Filled 2014-12-24: qty 2

## 2014-12-24 MED ORDER — BUPROPION HCL ER (SR) 150 MG PO TB12
150.0000 mg | ORAL_TABLET | Freq: Two times a day (BID) | ORAL | Status: DC
  Administered 2014-12-25: 150 mg via ORAL
  Filled 2014-12-24: qty 1

## 2014-12-24 MED ORDER — PANTOPRAZOLE SODIUM 40 MG PO TBEC: 40.0000 mg | DELAYED_RELEASE_TABLET | Freq: Every day | ORAL | Status: DC

## 2014-12-24 MED ORDER — ASPIRIN EC 81 MG PO TBEC: 81.0000 mg | DELAYED_RELEASE_TABLET | Freq: Every day | ORAL | Status: DC

## 2014-12-24 MED ORDER — ATORVASTATIN CALCIUM 20 MG PO TABS: 40.0000 mg | ORAL_TABLET | Freq: Every day | ORAL | Status: DC

## 2014-12-24 MED ORDER — ASPIRIN 300 MG RE SUPP
RECTAL | Status: AC
  Administered 2014-12-24: 300 mg via RECTAL
  Filled 2014-12-24: qty 1

## 2014-12-24 MED ORDER — INSULIN ASPART 100 UNIT/ML ~~LOC~~ SOLN
0.0000 [IU] | Freq: Four times a day (QID) | SUBCUTANEOUS | Status: DC
  Administered 2014-12-24 – 2014-12-25 (×3): 2 [IU] via SUBCUTANEOUS
  Filled 2014-12-24 (×3): qty 2

## 2014-12-24 MED ORDER — INSULIN GLARGINE 100 UNIT/ML ~~LOC~~ SOLN
50.0000 [IU] | Freq: Every day | SUBCUTANEOUS | Status: DC
  Filled 2014-12-24: qty 0.5

## 2014-12-24 MED ORDER — ASPIRIN 81 MG PO CHEW
243.0000 mg | CHEWABLE_TABLET | Freq: Once | ORAL | Status: AC
  Filled 2014-12-24: qty 3

## 2014-12-24 NOTE — ED Notes (Signed)
Pt to CT,. 

## 2014-12-24 NOTE — ED Provider Notes (Signed)
-----------------------------------------   2:58 PM on 12/24/2014 ----------------------------------------- (Note that documentation was delayed due to multiple ED patients requiring immediate care.)  I was informed of the patient's presence in the CT scanner and her status as a code stroke when it originally happened approximately 45 minutes ago.  I observed her when she was transported back from the [place] and into her room.  She is in no acute distress, her head CT was interpreted as nonacute by the radiologist, and she is far outside the window for TPA given that her symptoms were first noted at 9:00 today and she presented to the emergency department approximately 6 hours later.  She will be seen by one of the emergency physicians coming on shift at 3 PM.    Hinda Kehr, MD 12/24/14 1531

## 2014-12-24 NOTE — ED Notes (Signed)
Pt from home with face droop to left side and sleepiness. Pt is responsive to questions, answering appropriately. She was last known well at 2300 hrs  last night and was discovered to be drooping and having headache with slow answers to questions at 0900 today. Pt seems to have some difficulty/delay in sensory ability on left side. Left arm falls immediately to bed in NIH scale, but left leg can stay up for 10 seconds.

## 2014-12-24 NOTE — ED Notes (Signed)
Dr. Lord at bedside.  

## 2014-12-24 NOTE — Progress Notes (Signed)
   12/24/14 1500  Clinical Encounter Type  Visited With Patient and family together  Visit Type Initial;ED  Referral From Nurse  Consult/Referral To Chaplain  Spiritual Encounters  Spiritual Needs Literature;Brochure  Stress Factors  Patient Stress Factors Health changes  Family Stress Factors Health changes  Advance Directives (For Healthcare)  Does patient have an advance directive? Yes   Chaplain provided pastoral care to patient and daughter. Patient requested to update AD as her husband who was her Chauncey Reading is now deceased. Chaplain provided education and materials to update HCPOA.  AD [phone]

## 2014-12-24 NOTE — ED Notes (Signed)
BGL 202

## 2014-12-24 NOTE — Progress Notes (Signed)
Notified MD, patient is here for stroke, no stroke orders, negative CT. Dr. Posey Pronto ordered NIH stroke q12 hours, swallow screen, q2 neuro checks for 12 hours, then q 4 hours neuro checks with VS, temperature routine. Keep patient NPO, failed swallow screen.

## 2014-12-24 NOTE — Progress Notes (Signed)
Patient is NPO, has ordered 50 units of Lantus. Dr. Jannifer Franklin to put in orders for new insulin scale.
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
## 2014-12-24 NOTE — ED Notes (Signed)
Family noticed at 09:00 today , slurred speech , left facial droop,headache , slow to respond to questions

## 2014-12-24 NOTE — ED Notes (Signed)
Pt sleepier but still able to answer questions/respond.

## 2014-12-24 NOTE — ED Notes (Signed)
Pt in & out cath'd and aspirin suppository given. Pt seems to be speaking more clearly and is alert & oriented at this time. NAD noted.
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
## 2014-12-25 ENCOUNTER — Inpatient Hospital Stay: Payer: Medicare Other

## 2014-12-25 ENCOUNTER — Inpatient Hospital Stay
Admit: 2014-12-25 | Discharge: 2014-12-25 | Disposition: A | Discharge status: Home / Self Care | Payer: Medicare Other | Attending: Internal Medicine | Admitting: Internal Medicine

## 2014-12-25 DIAGNOSIS — I63411 Cerebral infarction due to embolism of right middle cerebral artery: Secondary | ICD-10-CM

## 2014-12-25 LAB — LIPID PANEL
CHOL/HDL RATIO: 10 ratio
CHOLESTEROL: 270 mg/dL — AB (ref 0–200)
HDL: 27 mg/dL — AB (ref >40)
LDL Cholesterol: 180 mg/dL — ABNORMAL HIGH (ref 0–99)
Triglycerides: 314 mg/dL — ABNORMAL HIGH (ref <150)
VLDL: 63 mg/dL — AB (ref 0–40)

## 2014-12-25 LAB — CBC
HCT: 40.8 % (ref 35.0–47.0)
HEMOGLOBIN: 13.4 g/dL (ref 12.0–16.0)
MCH: 28.7 pg (ref 26.0–34.0)
MCHC: 33 g/dL (ref 32.0–36.0)
MCV: 86.9 fL (ref 80.0–100.0)
Platelets: 133 10*3/uL — ABNORMAL LOW (ref 150–440)
RBC: 4.69 MIL/uL (ref 3.80–5.20)
RDW: 14.3 % (ref 11.5–14.5)
WBC: 4.9 10*3/uL (ref 3.6–11.0)

## 2014-12-25 LAB — BASIC METABOLIC PANEL
Anion gap: 11 (ref 5–15)
BUN: 29 mg/dL — ABNORMAL HIGH (ref 6–20)
CHLORIDE: 98 mmol/L — AB (ref 101–111)
CO2: 30 mmol/L (ref 22–32)
CREATININE: 1.1 mg/dL — AB (ref 0.44–1.00)
Calcium: 9 mg/dL (ref 8.9–10.3)
GFR calc non Af Amer: 48 mL/min — ABNORMAL LOW (ref >60)
GFR, EST AFRICAN AMERICAN: 55 mL/min — AB (ref >60)
GLUCOSE: 174 mg/dL — AB (ref 65–99)
Potassium: 3.4 mmol/L — ABNORMAL LOW (ref 3.5–5.1)
Sodium: 139 mmol/L (ref 135–145)

## 2014-12-25 LAB — GLUCOSE, CAPILLARY
GLUCOSE-CAPILLARY: 179 mg/dL — AB (ref 65–99)
Glucose-Capillary: 193 mg/dL — ABNORMAL HIGH (ref 65–99)

## 2014-12-25 LAB — HEMOGLOBIN A1C: HEMOGLOBIN A1C: 10.7 % — AB (ref 4.0–6.0)

## 2014-12-25 MED ORDER — SODIUM CHLORIDE 0.9 % IJ SOLN
3.0000 mL | Freq: Two times a day (BID) | INTRAMUSCULAR | Status: DC
  Administered 2014-12-25: 11:00:00 3 mL via INTRAVENOUS

## 2014-12-25 MED ORDER — ASPIRIN EC 81 MG PO TBEC: 81.0000 mg | DELAYED_RELEASE_TABLET | Freq: Every day | ORAL | Status: DC

## 2014-12-25 MED ORDER — APIXABAN 5 MG PO TABS: 5.0000 mg | ORAL_TABLET | Freq: Two times a day (BID) | ORAL | Status: AC

## 2014-12-25 MED ORDER — ATORVASTATIN CALCIUM 40 MG PO TABS: 40.0000 mg | ORAL_TABLET | Freq: Every day | ORAL | Status: AC

## 2014-12-25 MED ORDER — ACETAMINOPHEN 325 MG PO TABS
650.0000 mg | ORAL_TABLET | Freq: Four times a day (QID) | ORAL | Status: DC | PRN
  Administered 2014-12-25: 650 mg via ORAL
  Filled 2014-12-25: qty 2

## 2014-12-25 MED ORDER — ASPIRIN 300 MG RE SUPP
300.0000 mg | Freq: Every day | RECTAL | Status: DC
Start: 2014-12-25 — End: 2014-12-25
  Administered 2014-12-24 – 2014-12-25 (×2): 300 mg via RECTAL
  Filled 2014-12-25: qty 1

## 2014-12-25 MED ORDER — HYDRALAZINE HCL 20 MG/ML IJ SOLN
10.0000 mg | Freq: Once | INTRAMUSCULAR | Status: AC
  Administered 2014-12-25: 11:00:00 10 mg via INTRAVENOUS

## 2014-12-25 MED ORDER — PANTOPRAZOLE SODIUM 40 MG IV SOLR
40.0000 mg | INTRAVENOUS | Status: DC
  Administered 2014-12-25: 11:00:00 40 mg via INTRAVENOUS
  Filled 2014-12-25: qty 40

## 2014-12-25 MED ORDER — HYDRALAZINE HCL 20 MG/ML IJ SOLN: 10.0000 mg | Freq: Four times a day (QID) | INTRAMUSCULAR | Status: DC | PRN

## 2014-12-25 MED ORDER — SODIUM CHLORIDE 0.9 % IJ SOLN
3.0000 mL | INTRAMUSCULAR | Status: DC | PRN
  Administered 2014-12-25: 3 mL via INTRAVENOUS
  Filled 2014-12-25: qty 10

## 2014-12-25 MED ORDER — KETOROLAC TROMETHAMINE 15 MG/ML IJ SOLN
15.0000 mg | Freq: Three times a day (TID) | INTRAMUSCULAR | Status: DC | PRN
  Administered 2014-12-25: 15 mg via INTRAVENOUS
  Filled 2014-12-25: qty 1

## 2014-12-25 MED ORDER — DIGOXIN 0.25 MG/ML IJ SOLN
0.1250 mg | Freq: Every day | INTRAMUSCULAR | Status: DC
  Administered 2014-12-25: 0.125 mg via INTRAVENOUS
  Filled 2014-12-25: qty 0.5

## 2014-12-25 NOTE — Care Management Note (Signed)
Case Management Note  Patient Details  Name: CARRINE KROBOTH MRN: [id number] Date of Birth: 03/19/1941  Subjective/Objective:          Mrs Hobart chose [facility] as her home health provider for PT, RN, Speech, OT, and social worker. Referral was faxed and called to [facility].           Action/Plan:   Expected Discharge Date:                  Expected Discharge Plan:     In-House Referral:     Discharge planning Services     Post Acute Care Choice:    Choice offered to:     DME Arranged:    DME Agency:     HH Arranged:    [place] Agency:     Status of Service:     Medicare Important Message Given:    Date Medicare IM Given:    Medicare IM give by:    Date Additional Medicare IM Given:    Additional Medicare Important Message give by:     If discussed at [place] of Stay Meetings, dates discussed:    Additional Comments:  Sonam Wandel A, RN 12/25/2014, 1:34 PM

## 2014-12-25 NOTE — Progress Notes (Signed)
Pt has been off unit at intervals with US carotids done, Speech and neuro consults done. Pt just returned again from cardiac ECHO. Placed on regular diet with po meds updated.  PT consult still pending. Dgt remains at beside. Dgt reports left facial droop improved and weakness in LUE back to baseline. Elevated BP treated with hyralazine IV x 1 with MD aware. Initial recheck BP post hydralazine done after pt up to [facility] with second recheck in acceptable limits. Reports HA behind both eyes with ketrolac IV effectve. Noted occaional harsh nonproductive cough when not eating/drinking.

## 2014-12-25 NOTE — Progress Notes (Signed)
PT Cancellation Note  Patient Details Name: ADELYNNE JOERGER MRN: [id number] DOB: [birth year]   Cancelled Treatment:    Reason Eval/Treat Not Completed: Patient at procedure or test/unavailable. Chart reviewed, RN consulted: pt being tranported down to imaging at this time. Will attempt PT evaluation at later date/time.    Buccola,Allan C 12/25/2014, 11:59 AM 12:00 PM  Etta Grandchild, PT, DPT [place] License # [id number]

## 2014-12-25 NOTE — Progress Notes (Signed)
TC page to Dr. Benjie Karvonen returned with MRI report read to MD indicating positive infarct. MD will evaluate and evaluate for new orders.
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
## 2014-12-25 NOTE — Plan of Care (Signed)
Problem: Acute Rehab PT Goals(only PT should resolve) Goal: Pt Will Transfer Bed To Chair/Chair To Bed Pt will transfer sit to/from-stand with LRAD at supervision without loss-of-balance to demonstrate good safety awareness for independent mobility in home.     Goal: Pt Will Ambulate Pt will ambulate with LRAD at Supervision using a step-through pattern and equal step length for a distances greater than 253ft to demonstrate the ability to perform safe household distance ambulation at discharge.

## 2014-12-25 NOTE — Evaluation (Signed)
Clinical/Bedside Swallow Evaluation Patient Details  Name: Tanya Montgomery MRN: [id number] Date of Birth: [birth year]  Today's Date: 12/25/2014 Time: SLP Start Time (ACUTE ONLY): 1118 SLP Stop Time (ACUTE ONLY): 1140 SLP Time Calculation (min) (ACUTE ONLY): 22 min  Past Medical History:  Past Medical History  Diagnosis Date  . HTN (hypertension)   . Heart attack   . Diabetes insipidus   . High cholesterol   . Kidney disease   . Sleep apnea   . Breast cancer 2000   . Heart murmur   . COPD (chronic obstructive pulmonary disease)   . GERD (gastroesophageal reflux disease)   . Thyroid nodule 2014  . Stroke     TIA  . Diabetes mellitus without complication   . CHF (congestive heart failure)   . Asthma    Past Surgical History:  Past Surgical History  Procedure Laterality Date  . Total abdominal hysterectomy    . Mastectomy  2001  . Appendectomy    . Carotid stent    . Tonsillectomy and adenoidectomy    . Coronary angioplasty  1998  . Parathyroidectomy Right 2002    Right inferior gland  . Breast surgery Left 2001  . Cholecystectomy      2004  . Nissen fundoplication N/A [id number]   HPI:      Assessment / Plan / Recommendation Clinical Impression  pt presents with minimal pharyngeal dysphagia charactorized by pt cough x 1 with thin liquids via straw. pt with no overt ssx aspriaiton with thin liquids via cup, ice chips, or regular bolus trials. pt does present with a facial droop on the lef.  ST educated pt and daughter about recommendations to upgrade to reg with thin however not to use straws for intake.     Aspiration Risk  Mild    Diet Recommendation Age appropriate regular solids;Thin   Medication Administration: Whole meds with liquid Compensations: Follow solids with liquid;Slow rate;Small sips/bites    Other  Recommendations Oral Care Recommendations: Patient independent with oral care   Follow Up Recommendations       Frequency and Duration min 3x week   1 week   Pertinent Vitals/Pain No pain reported    SLP Swallow Goals     Swallow Study Prior Functional Status       General Date of Onset: 12/24/14 Type of Study: Bedside swallow evaluation Diet Prior to this Study: NPO Temperature Spikes Noted: N/A Respiratory Status: Room air History of Recent Intubation: No Behavior/Cognition: Alert;Cooperative;Pleasant mood Oral Cavity - Dentition: Adequate natural dentition/normal for age Self-Feeding Abilities: Able to feed self Patient Positioning: Upright in bed Baseline Vocal Quality: Normal Volitional Cough: Strong Volitional Swallow: Able to elicit    Oral/Motor/Sensory Function Overall Oral Motor/Sensory Function: Appears within functional limits for tasks assessed Labial Strength: Within Functional Limits Lingual Strength: Within Functional Limits   Ice Chips Ice chips: Within functional limits   Thin Liquid Thin Liquid: Within functional limits Presentation: Cup Other Comments: pt did have trials of thin liquid with straw and began to cough post intake, daughter stated that was baseline however st recommends no straws.    Nectar Thick Nectar Thick Liquid: Not tested   Honey Thick Honey Thick Liquid: Not tested   Puree Puree: Within functional limits Presentation: Self Fed   Solid   GO    Solid: Within functional limits       [employer] 12/25/2014,12:37 PM

## 2014-12-25 NOTE — Progress Notes (Addendum)
Pt and dgt ready for discharge home with dgt with [facility]. AVS given and signed. No further needs.  Discharged via [place] to private vehicle.
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
## 2014-12-25 NOTE — Progress Notes (Signed)
[place] worker at [phone]  She is going to come and see patient prior to discharge

## 2014-12-25 NOTE — Discharge Summary (Signed)
[facility] [place] NAME: Tanya Montgomery    MR#:  [id number]  DATE OF BIRTH:  [birth year]  DATE OF ADMISSION:  12/24/2014 ADMITTING PHYSICIAN: Vaughan Basta, MD  DATE OF DISCHARGE:12/25/2014 PRIMARY CARE PHYSICIAN: PROVIDER NOT IN SYSTEM    ADMISSION DIAGNOSIS:  Slurred speech [R47.81] CVA (cerebral infarction) [I63.9] Stroke [I63.9]  DISCHARGE DIAGNOSIS:  Principal Problem:   CVA (cerebral infarction)   SECONDARY DIAGNOSIS:   Past Medical History  Diagnosis Date  . HTN (hypertension)   . Heart attack   . Diabetes insipidus   . High cholesterol   . Kidney disease   . Sleep apnea   . Breast cancer 2000   . Heart murmur   . COPD (chronic obstructive pulmonary disease)   . GERD (gastroesophageal reflux disease)   . Thyroid nodule 2014  . Stroke     TIA  . Diabetes mellitus without complication   . CHF (congestive heart failure)   . Asthma     HOSPITAL COURSE:  This is 74 year old female with history of atrial fibrillation who presented with left facial droop and found have an acute stroke. For further history please refer to H&P.  1. Acute right MCA infarct: Patient presented with left facial droop in the setting of atrial fibrillation: Neurology was sought the patient in consultation. Her MRI was positive for an acute stroke. Carotid Doppler showed no evidence of hemodynamically significant stenosis. Patient will continue aspirin for 5 days then start Eliquis. I reviewed side effects, alternatives, benefits and risk of anticoagulation. Patient does elect to start Eliquis and accepts the risk of bleeding. Patient will be discharged with home health care. Patient was also evaluated by physical therapy as well as speech therapy. 2. Atrial fibrillation: Patient will continue on the [place] and Eliquis as stated above. Patient will follow-up with Dr. Ubaldo Glassing in 5 days.  3. Essential hypertension: Patient is allowed  to have permissive hypertension. She may resume her hypertensive medications tomorrow.  4. Diabetes: Patient may continue outpatient medications. DISCHARGE CONDITIONS AND DIET:  Patient's pain discharged with home health care in stable condition Patient will be on a regular diet with thin liquids no straws  CONSULTS OBTAINED:  Treatment Team:  Leotis Pain, MD  DRUG ALLERGIES:   Allergies  Allergen Reactions  . Levaquin [Levofloxacin In D5w] Other (See Comments)    Reaction:  Tachycardia    DISCHARGE MEDICATIONS:   Current Discharge Medication List    START taking these medications   Details  apixaban (ELIQUIS) 5 MG TABS tablet Take 1 tablet (5 mg total) by mouth 2 (two) times daily. Qty: 60 tablet, Refills: 0    atorvastatin (LIPITOR) 40 MG tablet Take 1 tablet (40 mg total) by mouth daily at 6 PM. Qty: 30 tablet, Refills: 0      CONTINUE these medications which have CHANGED   Details  aspirin EC 81 MG tablet Take 1 tablet (81 mg total) by mouth daily. Qty: 5 tablet, Refills: 0      CONTINUE these medications which have NOT CHANGED   Details  atenolol (TENORMIN) 25 MG tablet Take 25 mg by mouth 2 (two) times daily.    BIOTIN PO Take 1 capsule by mouth daily.    buPROPion (WELLBUTRIN SR) 150 MG 12 hr tablet Take 150 mg by mouth 2 (two) times daily.    Calcium Citrate-Vitamin D (CALCIUM + D PO) Take 1 tablet by mouth 2 (two) times daily.  digoxin (LANOXIN) 0.25 MG tablet Take 0.125-0.25 mg by mouth daily. Pt takes 0.125mg  on Monday, Wednesday, and Friday.   Pt takes 0.25mg  on Tuesday, Thursday, Saturday, and Sunday.    esomeprazole (NEXIUM) 40 MG capsule Take 40 mg by mouth 2 (two) times daily.     glipiZIDE (GLUCOTROL XL) 10 MG 24 hr tablet Take 10 mg by mouth daily.     insulin aspart (NOVOLOG) 100 UNIT/ML injection Inject 10 Units into the skin 3 (three) times daily before meals.    insulin glargine (LANTUS) 100 UNIT/ML injection Inject 50 Units into  the skin at bedtime.     isosorbide dinitrate (ISORDIL) 30 MG tablet Take 45 mg by mouth 2 (two) times daily.     Multiple Vitamins-Minerals (MULTIVITAMIN WITH MINERALS) tablet Take 1 tablet by mouth daily.      omega-3 acid ethyl esters (LOVAZA) 1 G capsule Take 2 g by mouth 2 (two) times daily.    potassium chloride (KLOR-CON) 10 MEQ CR tablet Take 10 mEq by mouth daily. Pt only takes four days per week.    valACYclovir (VALTREX) 1000 MG tablet Take 1,000 mg by mouth daily.    valsartan (DIOVAN) 160 MG tablet Take 160 mg by mouth daily.      venlafaxine (EFFEXOR) 75 MG tablet Take 75 mg by mouth 2 (two) times daily.                Today   CHIEF COMPLAINT:  Patient is doing well this morning. Patient continues have left facial droop. Patient denies any speech issues or focal neurological deficits.   VITAL SIGNS:  Blood pressure 182/71, pulse 90, temperature 99.1 F (37.3 C), temperature source Oral, resp. rate 20, height 5\' 2"  (1.575 m), weight 65.545 kg (144 lb 8 oz), SpO2 99 %.   REVIEW OF SYSTEMS:  Review of Systems  Constitutional: Negative for fever, chills and malaise/fatigue.  HENT: Negative for sore throat.   Eyes: Negative for blurred vision.  Respiratory: Negative for cough, hemoptysis, shortness of breath and wheezing.   Cardiovascular: Negative for chest pain, palpitations and leg swelling.  Gastrointestinal: Negative for nausea, vomiting, abdominal pain, diarrhea and blood in stool.  Genitourinary: Negative for dysuria.  Musculoskeletal: Negative for back pain.       Left shoulder pain and decreased ROM (OLD)  Neurological: Positive for speech change. Negative for dizziness, tremors, sensory change, focal weakness, seizures and headaches.       Facial droop  Endo/Heme/Allergies: Does not bruise/bleed easily.     PHYSICAL EXAMINATION:  GENERAL:  74 y.o.-year-old patient lying in the bed with no acute distress.  NECK:  Supple, no jugular venous  distention. No thyroid enlargement, no tenderness.  LUNGS: Normal breath sounds bilaterally, no wheezing, rales,rhonchi  No use of accessory muscles of respiration.  CARDIOVASCULAR: S1, S2 normal. No murmurs, rubs, or gallops.  ABDOMEN: Soft, non-tender, non-distended. Bowel sounds present. No organomegaly or mass.  EXTREMITIES: No pedal edema, cyanosis, or clubbing.  PSYCHIATRIC: The patient is alert and oriented x 3.  SKIN: No obvious rash, lesion, or ulcer.  Patient has left facial droop left shoulder has decreased range of motion which is old. DATA REVIEW:   CBC  Recent Labs Lab 12/25/14 0453  WBC 4.9  HGB 13.4  HCT 40.8  PLT 133*    Chemistries   Recent Labs Lab 12/24/14 1510 12/25/14 0453  NA 133* 139  K 4.8 3.4*  CL 94* 98*  CO2 27 30  GLUCOSE 255*  174*  BUN 32* 29*  CREATININE 1.48* 1.10*  CALCIUM 9.4 9.0  AST 55*  --   ALT 68*  --   ALKPHOS 78  --   BILITOT 0.6  --     Cardiac Enzymes  Recent Labs Lab 12/24/14 1510  TROPONINI <0.03    Microbiology Results  @MICRORSLT48 @  RADIOLOGY:  Ct Head Wo Contrast  12/24/2014   IMPRESSION: 1. No acute intracranial pathology. 2. Chronic microvascular disease and cerebral atrophy.   Electronically Signed   By: Kathreen Devoid   On: 12/24/2014 14:57   Mr Brain Wo Contrast  12/25/2014     IMPRESSION: Areas of acute infarction in the right middle cerebral artery territory including the cortical and subcortical brain an the right basal ganglia/ internal capsule region. The largest confluent area of infarction is a 3 cm region in the right parietal cortical and subcortical brain. No hemorrhage or mass effect.   Electronically Signed   By: Nelson Chimes M.D.   On: 12/25/2014 09:46   US Carotid Bilateral  12/25/2014   C IMPRESSION: Color duplex indicates moderate heterogeneous and calcified plaque with no hemodynamically significant stenosis by duplex criteria in the extracranial cerebrovascular circulation.  Signed,   Dulcy Fanny. Earleen Newport, DO  [facility]  [facility]   Electronically Signed   By: Corrie Mckusick D.O.   On: 12/25/2014 10:29      Management plans discussed with the patient and daughter and they are in agreement. Stable for discharge home with home health care  Patient should follow up with cardiology in 3-5 days and PCP in one week  CODE STATUS:     Code Status Orders        Start     Ordered   12/24/14 1941  Do not attempt resuscitation (DNR)   Continuous    Question Answer Comment  In the event of cardiac or respiratory ARREST Do not call a "code blue"   In the event of cardiac or respiratory ARREST Do not perform Intubation, CPR, defibrillation or ACLS   In the event of cardiac or respiratory ARREST Use medication by any route, position, wound care, and other measures to relive pain and suffering. May use oxygen, suction and manual treatment of airway obstruction as needed for comfort.   Comments pt confirmed.      12/24/14 1940      TOTAL TIME TAKING CARE OF THIS PATIENT: 35 minutes.    Joanny Dupree M.D on 12/25/2014 at 11:51 AM  Between 7am to 6pm - Pager - [phone] After 6pm go to [email or web address] - password [email or web address] [facility]  Office  [phone]  CC: Primary care physician; PROVIDER NOT IN SYSTEM

## 2014-12-25 NOTE — Progress Notes (Signed)
Pt being discharged home with dgt with [facility]HS referral in place.. Oral and written Stroke education given with handbook. Workup and consults completed. Tylenol given for HA. Oral meds and diet tolerated well. Up with 1+ BSC. Oral and written AVS discharge instructions given to pt and dgt with stated understanding.
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
## 2014-12-25 NOTE — Evaluation (Signed)
Physical Therapy Evaluation Patient Details Name: ARGUSTA MCGANN MRN: [id number] DOB: 11/18/1940 Today's Date: 12/25/2014   History of Present Illness  Pt is a 74yo white female who was encountered at home by daughter, found to in [place], c L facial droop and HA. Pt brought to [facility] found to be s/p CVA.    Clinical Impression  Pt is received semirecumbent in bed upon entry, awake, alert, and willing to participate. Pt is A&Ox3 and pleasant. Daughter is at bedside and corroborates history. Pt reports multiple falls in the last 6 months, but is unable to explain why, except that her balance is not great and she is not as compliant c DME as she knows she should be. Pt strength as screened by MMT is 5/5 throughout with mildly weak grip strength. LUE presents c strength and mobility deficits that are chronic, remote, and unrelated to current CC. Pt falls risk is high as evidenced by slow gait speed, poor forward reach, and altered posturing during transfers and mobility, as well as multiple LOB during evaluation requiring PT to correct. Patient presenting with impairment of balance, coordination, and activity tolerance, limiting ability to perform ADL and mobility tasks at  baseline level of function. Patient will benefit from skilled intervention to address the above impairments and limitations, in order to restore to prior level of function, improve patient safety upon discharge, and to decrease falls risk.       Follow Up Recommendations Home health PT;Supervision for mobility/OOB    Equipment Recommendations   (hemi walker )    Recommendations for Other Services       Precautions / Restrictions Precautions Precautions: None Restrictions Weight Bearing Restrictions: No      Mobility  Bed Mobility Overal bed mobility: Modified Independent                Transfers Overall transfer level: Needs assistance Equipment used: None Transfers: Sit to/from Stand Sit to Stand: Min  guard         General transfer comment: Unsteady and wobbly, partial LOB during attempts to come to standing.   Ambulation/Gait Ambulation/Gait assistance: Min assist Ambulation Distance (Feet): 180 Feet Assistive device: 1 person hand held assist Gait Pattern/deviations: Staggering right;Drifts right/left;Trunk flexed Gait velocity: 0.89m/s  Gait velocity interpretation: <1.8 ft/sec, indicative of risk for recurrent falls General Gait Details: Gait is slow, unsteady, freq loss of balance 3+ require PT to correct.   Stairs            Wheelchair Mobility    Modified Rankin (Stroke Patients Only)       Balance Overall balance assessment: History of Falls;Needs assistance         Standing balance support: Single extremity supported Standing balance-Leahy Scale: Poor                 High Level Balance Comments: unable to balance during normal ambulatory tasks, not appropriate for standardized testing at this time.              Pertinent Vitals/Pain Pain Assessment: No/denies pain    Home Living Family/patient expects to be discharged to:: Other (Comment) (daughters home) Living Arrangements: Alone Available Help at Discharge: Family Type of Home: House Home Access: Level entry     Home Layout: One level Home Equipment: Cane - single point;Walker - 2 wheels      Prior Function Level of Independence: Independent with assistive device(s)  Hand Dominance        Extremity/Trunk Assessment   Upper Extremity Assessment: LUE deficits/detail       LUE Deficits / Details: chronic shoulder problem, s/p multiple surgeries and partial loss of function. Grip strength WFL.    Lower Extremity Assessment: Generalized weakness         Communication   Communication: No difficulties  Cognition Arousal/Alertness: Awake/alert Behavior During Therapy: WFL for tasks assessed/performed Overall Cognitive Status: Within Functional  Limits for tasks assessed                      General Comments      Exercises        Assessment/Plan    PT Assessment Patient needs continued PT services  PT Diagnosis Difficulty walking;Abnormality of gait;Hemiplegia non-dominant side   PT Problem List Decreased strength;Decreased activity tolerance;Decreased balance;Decreased mobility;Decreased coordination  PT Treatment Interventions DME instruction;Gait training;Stair training;Functional mobility training;Therapeutic activities;Therapeutic exercise;Balance training;Patient/family education   PT Goals (Current goals can be found in the Care Plan section) Acute Rehab PT Goals Patient Stated Goal: return to PLOF In ADL/IADL PT Goal Formulation: With patient Time For Goal Achievement: 01/08/15 Potential to Achieve Goals: Good    Frequency 7X/week   Barriers to discharge        Co-evaluation               End of Session Equipment Utilized During Treatment: Gait belt Activity Tolerance: Patient tolerated treatment well;Patient limited by fatigue Patient left: in bed;with bed alarm set;with family/visitor present Nurse Communication: Mobility status         Time: 2694-8546 PT Time Calculation (min) (ACUTE ONLY): 15 min   Charges:   PT Evaluation $Initial PT Evaluation Tier I: 1 Procedure     PT G Codes:        Buccola,Allan C Jan 11, 2015, 2:50 PM  2:54 PM  Etta Grandchild, PT, DPT [place] License # [id number]

## 2015-01-11 ENCOUNTER — Emergency Department: Payer: Medicare Other

## 2015-01-11 ENCOUNTER — Inpatient Hospital Stay
Admission: EM | Admit: 2015-01-11 | Discharge: 2015-01-14 | DRG: 065 | Disposition: A | Discharge status: Skilled Nursing Facility | Payer: Medicare Other | Attending: Internal Medicine | Admitting: Internal Medicine

## 2015-01-11 ENCOUNTER — Encounter: Payer: Self-pay | Admitting: Emergency Medicine

## 2015-01-11 DIAGNOSIS — Z791 Long term (current) use of non-steroidal anti-inflammatories (NSAID): Secondary | ICD-10-CM | POA: Diagnosis not present

## 2015-01-11 DIAGNOSIS — R011 Cardiac murmur, unspecified: Secondary | ICD-10-CM | POA: Diagnosis present

## 2015-01-11 DIAGNOSIS — Z885 Allergy status to narcotic agent status: Secondary | ICD-10-CM | POA: Diagnosis not present

## 2015-01-11 DIAGNOSIS — Z7901 Long term (current) use of anticoagulants: Secondary | ICD-10-CM | POA: Diagnosis not present

## 2015-01-11 DIAGNOSIS — Z9010 Acquired absence of unspecified breast and nipple: Secondary | ICD-10-CM | POA: Diagnosis present

## 2015-01-11 DIAGNOSIS — I509 Heart failure, unspecified: Secondary | ICD-10-CM | POA: Diagnosis present

## 2015-01-11 DIAGNOSIS — Z853 Personal history of malignant neoplasm of breast: Secondary | ICD-10-CM | POA: Diagnosis not present

## 2015-01-11 DIAGNOSIS — Z8673 Personal history of transient ischemic attack (TIA), and cerebral infarction without residual deficits: Secondary | ICD-10-CM

## 2015-01-11 DIAGNOSIS — A047 Enterocolitis due to Clostridium difficile: Secondary | ICD-10-CM | POA: Diagnosis present

## 2015-01-11 DIAGNOSIS — R296 Repeated falls: Secondary | ICD-10-CM | POA: Diagnosis present

## 2015-01-11 DIAGNOSIS — G4730 Sleep apnea, unspecified: Secondary | ICD-10-CM | POA: Diagnosis present

## 2015-01-11 DIAGNOSIS — E780 Pure hypercholesterolemia: Secondary | ICD-10-CM | POA: Diagnosis present

## 2015-01-11 DIAGNOSIS — R269 Unspecified abnormalities of gait and mobility: Secondary | ICD-10-CM | POA: Diagnosis present

## 2015-01-11 DIAGNOSIS — E119 Type 2 diabetes mellitus without complications: Secondary | ICD-10-CM | POA: Diagnosis present

## 2015-01-11 DIAGNOSIS — R4182 Altered mental status, unspecified: Secondary | ICD-10-CM | POA: Diagnosis present

## 2015-01-11 DIAGNOSIS — E041 Nontoxic single thyroid nodule: Secondary | ICD-10-CM | POA: Diagnosis present

## 2015-01-11 DIAGNOSIS — Z79899 Other long term (current) drug therapy: Secondary | ICD-10-CM

## 2015-01-11 DIAGNOSIS — I4891 Unspecified atrial fibrillation: Secondary | ICD-10-CM | POA: Diagnosis present

## 2015-01-11 DIAGNOSIS — R319 Hematuria, unspecified: Secondary | ICD-10-CM | POA: Diagnosis present

## 2015-01-11 DIAGNOSIS — E232 Diabetes insipidus: Secondary | ICD-10-CM | POA: Diagnosis present

## 2015-01-11 DIAGNOSIS — J45909 Unspecified asthma, uncomplicated: Secondary | ICD-10-CM | POA: Diagnosis present

## 2015-01-11 DIAGNOSIS — N289 Disorder of kidney and ureter, unspecified: Secondary | ICD-10-CM | POA: Diagnosis present

## 2015-01-11 DIAGNOSIS — Z794 Long term (current) use of insulin: Secondary | ICD-10-CM | POA: Diagnosis not present

## 2015-01-11 DIAGNOSIS — K219 Gastro-esophageal reflux disease without esophagitis: Secondary | ICD-10-CM | POA: Diagnosis present

## 2015-01-11 DIAGNOSIS — Z515 Encounter for palliative care: Secondary | ICD-10-CM | POA: Diagnosis not present

## 2015-01-11 DIAGNOSIS — I639 Cerebral infarction, unspecified: Secondary | ICD-10-CM | POA: Diagnosis not present

## 2015-01-11 DIAGNOSIS — I252 Old myocardial infarction: Secondary | ICD-10-CM | POA: Diagnosis not present

## 2015-01-11 DIAGNOSIS — Z66 Do not resuscitate: Secondary | ICD-10-CM | POA: Diagnosis present

## 2015-01-11 DIAGNOSIS — J449 Chronic obstructive pulmonary disease, unspecified: Secondary | ICD-10-CM | POA: Diagnosis not present

## 2015-01-11 LAB — CBC WITH DIFFERENTIAL/PLATELET
BASOS ABS: 0 10*3/uL (ref 0–0.1)
BASOS PCT: 0 %
EOS ABS: 0 10*3/uL (ref 0–0.7)
Eosinophils Relative: 0 %
HEMATOCRIT: 39.5 % (ref 35.0–47.0)
Hemoglobin: 13.3 g/dL (ref 12.0–16.0)
Lymphocytes Relative: 12 %
Lymphs Abs: 0.7 10*3/uL — ABNORMAL LOW (ref 1.0–3.6)
MCH: 29.4 pg (ref 26.0–34.0)
MCHC: 33.7 g/dL (ref 32.0–36.0)
MCV: 87.1 fL (ref 80.0–100.0)
MONO ABS: 0.5 10*3/uL (ref 0.2–0.9)
Monocytes Relative: 8 %
NEUTROS ABS: 5 10*3/uL (ref 1.4–6.5)
Neutrophils Relative %: 80 %
PLATELETS: 123 10*3/uL — AB (ref 150–440)
RBC: 4.54 MIL/uL (ref 3.80–5.20)
RDW: 15.3 % — AB (ref 11.5–14.5)
WBC: 6.3 10*3/uL (ref 3.6–11.0)

## 2015-01-11 LAB — COMPREHENSIVE METABOLIC PANEL
ALBUMIN: 3.5 g/dL (ref 3.5–5.0)
ALT: 32 U/L (ref 14–54)
ANION GAP: 9 (ref 5–15)
AST: 32 U/L (ref 15–41)
Alkaline Phosphatase: 69 U/L (ref 38–126)
BILIRUBIN TOTAL: 1 mg/dL (ref 0.3–1.2)
BUN: 11 mg/dL (ref 6–20)
CHLORIDE: 100 mmol/L — AB (ref 101–111)
CO2: 28 mmol/L (ref 22–32)
Calcium: 8.9 mg/dL (ref 8.9–10.3)
Creatinine, Ser: 0.77 mg/dL (ref 0.44–1.00)
GFR calc Af Amer: >60 mL/min (ref >60)
GFR calc non Af Amer: >60 mL/min (ref >60)
GLUCOSE: 242 mg/dL — AB (ref 65–99)
POTASSIUM: 3.6 mmol/L (ref 3.5–5.1)
SODIUM: 137 mmol/L (ref 135–145)
Total Protein: 6.5 g/dL (ref 6.5–8.1)

## 2015-01-11 LAB — URINALYSIS COMPLETE WITH MICROSCOPIC (ARMC ONLY)
Bilirubin Urine: NEGATIVE
Glucose, UA: NEGATIVE mg/dL
HGB URINE DIPSTICK: NEGATIVE
KETONES UR: NEGATIVE mg/dL
Leukocytes, UA: NEGATIVE
Nitrite: NEGATIVE
PH: 5 (ref 5.0–8.0)
PROTEIN: 30 mg/dL — AB
RBC / HPF: NONE SEEN RBC/hpf (ref 0–5)
Specific Gravity, Urine: 1.013 (ref 1.005–1.030)
Squamous Epithelial / LPF: NONE SEEN
WBC UA: NONE SEEN WBC/hpf (ref 0–5)

## 2015-01-11 LAB — GLUCOSE, CAPILLARY
GLUCOSE-CAPILLARY: 188 mg/dL — AB (ref 65–99)
GLUCOSE-CAPILLARY: 173 mg/dL — AB (ref 65–99)

## 2015-01-11 LAB — TROPONIN I: Troponin I: <0.03 ng/mL (ref <0.031)

## 2015-01-11 MED ORDER — INSULIN ASPART 100 UNIT/ML ~~LOC~~ SOLN
0.0000 [IU] | Freq: Three times a day (TID) | SUBCUTANEOUS | Status: DC
  Administered 2015-01-12 (×2): 3 [IU] via SUBCUTANEOUS
  Administered 2015-01-13: 10:00:00 2 [IU] via SUBCUTANEOUS
  Administered 2015-01-13: 13:00:00 3 [IU] via SUBCUTANEOUS
  Filled 2015-01-11: qty 2
  Filled 2015-01-11: qty 3
  Filled 2015-01-11: qty 2
  Filled 2015-01-11: qty 3

## 2015-01-11 MED ORDER — DIGOXIN 250 MCG PO TABS
0.2500 mg | ORAL_TABLET | ORAL | Status: DC
  Administered 2015-01-12 – 2015-01-13 (×2): 0.25 mg via ORAL
  Filled 2015-01-11 (×2): qty 1

## 2015-01-11 MED ORDER — INSULIN ASPART 100 UNIT/ML ~~LOC~~ SOLN
0.0000 [IU] | Freq: Every day | SUBCUTANEOUS | Status: DC
  Administered 2015-01-12: 5 [IU] via SUBCUTANEOUS
  Filled 2015-01-11: qty 2

## 2015-01-11 MED ORDER — OMEGA-3-ACID ETHYL ESTERS 1 G PO CAPS
2.0000 g | ORAL_CAPSULE | Freq: Two times a day (BID) | ORAL | Status: DC
  Administered 2015-01-12 (×2): 2 g via ORAL
  Filled 2015-01-11 (×4): qty 2

## 2015-01-11 MED ORDER — BUPROPION HCL ER (SR) 150 MG PO TB12
150.0000 mg | ORAL_TABLET | Freq: Two times a day (BID) | ORAL | Status: DC
  Administered 2015-01-12 – 2015-01-14 (×5): 150 mg via ORAL
  Filled 2015-01-11 (×5): qty 1

## 2015-01-11 MED ORDER — SODIUM CHLORIDE 0.9 % IJ SOLN
3.0000 mL | Freq: Two times a day (BID) | INTRAMUSCULAR | Status: DC
  Administered 2015-01-11 – 2015-01-13 (×5): 3 mL via INTRAVENOUS

## 2015-01-11 MED ORDER — DIGOXIN 125 MCG PO TABS
125.0000 mg | ORAL_TABLET | ORAL | Status: DC
  Filled 2015-01-11: qty 1000

## 2015-01-11 MED ORDER — ATORVASTATIN CALCIUM 20 MG PO TABS
40.0000 mg | ORAL_TABLET | Freq: Every day | ORAL | Status: DC
  Administered 2015-01-12 – 2015-01-13 (×2): 40 mg via ORAL
  Filled 2015-01-11 (×2): qty 2

## 2015-01-11 MED ORDER — VALACYCLOVIR HCL 500 MG PO TABS
1000.0000 mg | ORAL_TABLET | Freq: Every day | ORAL | Status: DC
  Administered 2015-01-12 – 2015-01-14 (×3): 1000 mg via ORAL
  Filled 2015-01-11 (×3): qty 2

## 2015-01-11 MED ORDER — ATENOLOL 25 MG PO TABS
25.0000 mg | ORAL_TABLET | Freq: Two times a day (BID) | ORAL | Status: DC
  Administered 2015-01-12 – 2015-01-14 (×5): 25 mg via ORAL
  Filled 2015-01-11 (×7): qty 1

## 2015-01-11 MED ORDER — BISACODYL 10 MG RE SUPP
10.0000 mg | Freq: Every day | RECTAL | Status: DC | PRN
  Administered 2015-01-11: 20:00:00 10 mg via RECTAL
  Filled 2015-01-11: qty 1

## 2015-01-11 MED ORDER — TRAMADOL HCL 50 MG PO TABS
50.0000 mg | ORAL_TABLET | Freq: Every day | ORAL | Status: DC | PRN
  Administered 2015-01-12 (×2): 50 mg via ORAL
  Filled 2015-01-11 (×2): qty 1

## 2015-01-11 MED ORDER — ASPIRIN 300 MG RE SUPP
150.0000 mg | Freq: Every day | RECTAL | Status: DC
  Administered 2015-01-11: 150 mg via RECTAL
  Filled 2015-01-11: qty 1

## 2015-01-11 MED ORDER — BIOTIN 2.5 MG PO TABS: ORAL_TABLET | Freq: Every day | ORAL | Status: DC

## 2015-01-11 MED ORDER — ISOSORBIDE DINITRATE 20 MG PO TABS
45.0000 mg | ORAL_TABLET | Freq: Two times a day (BID) | ORAL | Status: DC
  Administered 2015-01-12 – 2015-01-14 (×5): 45 mg via ORAL
  Filled 2015-01-11: qty 2
  Filled 2015-01-11 (×5): qty 1

## 2015-01-11 MED ORDER — DIGOXIN 125 MCG PO TABS
0.1250 mg | ORAL_TABLET | ORAL | Status: DC
  Administered 2015-01-12 – 2015-01-14 (×2): 0.125 mg via ORAL
  Filled 2015-01-11 (×2): qty 1

## 2015-01-11 MED ORDER — INSULIN ASPART 100 UNIT/ML ~~LOC~~ SOLN
10.0000 [IU] | Freq: Three times a day (TID) | SUBCUTANEOUS | Status: DC
  Administered 2015-01-12 (×2): 10 [IU] via SUBCUTANEOUS
  Filled 2015-01-11 (×3): qty 10

## 2015-01-11 MED ORDER — APIXABAN 5 MG PO TABS
5.0000 mg | ORAL_TABLET | Freq: Two times a day (BID) | ORAL | Status: DC
  Administered 2015-01-12 – 2015-01-14 (×6): 5 mg via ORAL
  Filled 2015-01-11 (×7): qty 1

## 2015-01-11 NOTE — ED Notes (Signed)
Pt passed stroke swallow screen, and when she began to eat lunch, she choked on her food. Charge nurse went in to pt's room and helped her, discovering that she had no problem taking in liquids, but choked on either chicken or potato. Pt aware that she should not attempt eating solid foods again at this time.

## 2015-01-11 NOTE — H&P (Signed)
[facility] NAME: Tanya Montgomery    MR#:  [id number]  DATE OF BIRTH:  1940-12-18  DATE OF ADMISSION:  01/11/2015  PRIMARY CARE PHYSICIAN: PROVIDER NOT IN SYSTEM   REQUESTING/REFERRING PHYSICIAN: [email or web address]  CHIEF COMPLAINT:    HISTORY OF PRESENT ILLNESS:  Tanya Montgomery  is a 74 y.o. female with a known history of renal stones, high cholesterol, sleep apnea breast cancer, COPD, GERD and Multiple other medical problems  PAST MEDICAL HISTORY:   Past Medical History  Diagnosis Date  . HTN (hypertension)   . Heart attack   . Diabetes insipidus   . High cholesterol   . Kidney disease   . Sleep apnea   . Breast cancer 2000   . Heart murmur   . COPD (chronic obstructive pulmonary disease)   . GERD (gastroesophageal reflux disease)   . Thyroid nodule 2014  . Stroke     TIA  . Diabetes mellitus without complication   . CHF (congestive heart failure)   . Asthma     PAST SURGICAL HISTOIRY:   Past Surgical History  Procedure Laterality Date  . Total abdominal hysterectomy    . Mastectomy  2001  . Appendectomy    . Carotid stent    . Tonsillectomy and adenoidectomy    . Coronary angioplasty  1998  . Parathyroidectomy Right 2002    Right inferior gland  . Breast surgery Left 2001  . Cholecystectomy      2004  . Nissen fundoplication N/A 9629    SOCIAL HISTORY:   Social History  Substance Use Topics  . Smoking status: Never Smoker   . Smokeless tobacco: Never Used  . Alcohol Use: No     Comment: Occasional drinker    FAMILY HISTORY:   Family History  Problem Relation Age of Onset  . Emphysema Father   . Lung cancer Father   . Cancer Father     Thyroid  . Heart disease Mother     died age 73  . Brain cancer Son     died age 57    DRUG ALLERGIES:   Allergies  Allergen Reactions  . Levaquin [Levofloxacin In D5w] Other (See Comments)    Reaction:  Tachycardia    REVIEW OF SYSTEMS:   CONSTITUTIONAL: No fever, fatigue or weakness.  EYES: No blurred or double vision.  EARS, NOSE, AND THROAT: No tinnitus or ear pain.  RESPIRATORY: No cough, shortness of breath, wheezing or hemoptysis.  CARDIOVASCULAR: No chest pain, orthopnea, edema.  GASTROINTESTINAL: No nausea, vomiting, diarrhea or abdominal pain.  GENITOURINARY: No dysuria, hematuria.  ENDOCRINE: No polyuria, nocturia,  HEMATOLOGY: No anemia, easy bruising or bleeding SKIN: No rash or lesion. MUSCULOSKELETAL: No joint pain or arthritis.   NEUROLOGIC: No tingling, numbness, weakness.  PSYCHIATRY: No anxiety or depression.   MEDICATIONS AT HOME:   Prior to Admission medications   Medication Sig Start Date End Date Taking? Authorizing Provider  apixaban (ELIQUIS) 5 MG TABS tablet Take 1 tablet (5 mg total) by mouth 2 (two) times daily. 12/30/14  Yes Bettey Costa, MD  atenolol (TENORMIN) 25 MG tablet Take 25 mg by mouth 2 (two) times daily.   Yes Historical Provider, MD  atorvastatin (LIPITOR) 40 MG tablet Take 1 tablet (40 mg total) by mouth daily at 6 PM. 12/25/14  Yes Sital Mody, MD  BIOTIN PO Take 1 capsule by mouth daily.   Yes Historical Provider, MD  buPROPion [facility] SR)  150 MG 12 hr tablet Take 150 mg by mouth 2 (two) times daily.   Yes Historical Provider, MD  Calcium Citrate-Vitamin D (CALCIUM + D PO) Take 1 tablet by mouth 2 (two) times daily.   Yes Historical Provider, MD  digoxin (LANOXIN) 0.25 MG tablet Take 0.125-0.25 mg by mouth daily. Pt takes 0.125mg  on Monday, Wednesday, and Friday.   Pt takes 0.25mg  on Tuesday, Thursday, Saturday, and Sunday.   Yes Historical Provider, MD  esomeprazole (NEXIUM) 40 MG capsule Take 40 mg by mouth 2 (two) times daily.    Yes Historical Provider, MD  furosemide (LASIX) 20 MG tablet Take 20 mg by mouth daily.   Yes Historical Provider, MD  glipiZIDE (GLUCOTROL XL) 10 MG 24 hr tablet Take 10 mg by mouth daily.    Yes Historical Provider, MD  insulin aspart (NOVOLOG) 100  UNIT/ML injection Inject 10 Units into the skin 3 (three) times daily before meals.   Yes Historical Provider, MD  insulin glargine (LANTUS) 100 UNIT/ML injection Inject 50 Units into the skin at bedtime.    Yes Historical Provider, MD  isosorbide dinitrate (ISORDIL) 30 MG tablet Take 45 mg by mouth 2 (two) times daily.    Yes Historical Provider, MD  Multiple Vitamins-Minerals (MULTIVITAMIN WITH MINERALS) tablet Take 1 tablet by mouth daily.     Yes Historical Provider, MD  naproxen sodium (ANAPROX) 220 MG tablet Take 220 mg by mouth as needed.   Yes Historical Provider, MD  potassium chloride (KLOR-CON) 10 MEQ CR tablet Take 10 mEq by mouth daily. Pt only takes four days per week.   Yes Historical Provider, MD  traMADol (ULTRAM) 50 MG tablet Take 50 mg by mouth daily as needed.   Yes Historical Provider, MD  valACYclovir (VALTREX) 1000 MG tablet Take 1,000 mg by mouth daily.   Yes Historical Provider, MD  valsartan (DIOVAN) 160 MG tablet Take 160 mg by mouth daily.     Yes Historical Provider, MD  venlafaxine (EFFEXOR) 75 MG tablet Take 75 mg by mouth 2 (two) times daily.     Yes Historical Provider, MD  aspirin EC 81 MG tablet Take 1 tablet (81 mg total) by mouth daily. 12/25/14   Bettey Costa, MD  omega-3 acid ethyl esters (LOVAZA) 1 G capsule Take 2 g by mouth 2 (two) times daily.    Historical Provider, MD      VITAL SIGNS:  Blood pressure 163/96, pulse 73, temperature 97.8 F (36.6 C), temperature source Oral, resp. rate 19, height 5\' 2"  (1.575 m), weight 66.679 kg (147 lb), SpO2 93 %.  PHYSICAL EXAMINATION:  2GENERAL:  74 y.o.-year-old patient lying in the bed with no acute distress.  EYES: Pupils equal, round, reactive to light and accommodation. No scleral icterus. Extraocular muscles intact.  HEENT: Head atraumatic, normocephalic. Oropharynx and nasopharynx clear.  NECK:  Supple, no jugular venous distention. No thyroid enlargement, no tenderness.  LUNGS: Normal breath sounds  bilaterally, no wheezing, rales,rhonchi or crepitation. No use of accessory muscles of respiration.  CARDIOVASCULAR: S1, S2 normal. No murmurs, rubs, or gallops.  ABDOMEN: Soft, nontender, nondistended. Bowel sounds present. No organomegaly or mass.  EXTREMITIES: No pedal edema, cyanosis, or clubbing.  NEUROLOGIC: Cranial nerves II through XII are intact. Muscle strength 5/5 in all extremities. Sensation intact. Gait not checked.  PSYCHIATRIC: The patient is alert and oriented x 3.  SKIN: No obvious rash, lesion, or ulcer.   LABORATORY PANEL:   CBC  Recent Labs Lab 01/11/15 0919  WBC 6.3  HGB 13.3  HCT 39.5  PLT 123*   ------------------------------------------------------------------------------------------------------------------  Chemistries   Recent Labs Lab 01/11/15 0919  NA 137  K 3.6  CL 100*  CO2 28  GLUCOSE 242*  BUN 11  CREATININE 0.77  CALCIUM 8.9  AST 32  ALT 32  ALKPHOS 69  BILITOT 1.0   ------------------------------------------------------------------------------------------------------------------  Cardiac Enzymes  Recent Labs Lab 01/11/15 0919  TROPONINI <0.03   ------------------------------------------------------------------------------------------------------------------  RADIOLOGY:  Dg Chest 1 View  01/11/2015   CLINICAL DATA:  Recurrent falls and varying prominence since 01/07/2015. Intermittent altered mental status.  EXAM: CHEST  1 VIEW  COMPARISON:  PA and lateral chest 03/16/2013 and CT chest 01/29/2013.  FINDINGS: The patient is status post left mastectomy and axillary dissection. The lungs appear clear. Heart size is enlarged. There is no pneumothorax or pleural effusion. 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  IMPRESSION: Cardiomegaly without acute disease.   Electronically Signed   By: Inge Rise M.D.   On: 01/11/2015 11:36   Ct Head Wo Contrast  01/11/2015   CLINICAL DATA:  Altered mental status. CVA 3 weeks  ago. Confusion with falling. Urinary frequency.  EXAM: CT HEAD WITHOUT CONTRAST  TECHNIQUE: Contiguous axial images were obtained from the base of the skull through the vertex without intravenous contrast.  COMPARISON:  12/25/2014 MRI.  FINDINGS: Expected evolution of RIGHT parietal infarct and other smaller lacunar infarcts seen on prior MRI. Cortical laminar necrosis is present in the RIGHT parietal lobe. No hemorrhagic transformation is present. Dilated perivascular spaces and old lacunar infarcts are present. These appear similar to the prior MRI. Atrophy and chronic ischemic white matter disease appears similar. The calvarium is intact. Mastoid air cells clear. Intracranial atherosclerosis.  IMPRESSION: 1. Expected evolution of RIGHT parietal infarct with cortical laminar necrosis. No hemorrhagic transformation. 2. Atrophy and chronic ischemic white matter disease. Scattered lacunar infarcts and dilated perivascular spaces. 3. No acute intracranial abnormality.   Electronically Signed   By: Dereck Ligas M.D.   On: 01/11/2015 09:39    EKG:   Orders placed or performed during the hospital encounter of 01/11/15  . EKG 12-Lead  . EKG 12-Lead    IMPRESSION AND PLAN:   Acute evoluation of the right parietal infarct:  -Will admit the patient to telemetry -Will obtain diffuse ADC  MRI of the brain as recommended by neurology -We'll continue Eliquis 5 mg by mouth twice a day if patient passes bedside swallow evaluation test -RN to perform bedside swallow evaluation test and if needed therapy consult for further evaluation -Continue statin as LDL is elevated at 180 on August 20 -Carotid Dopplers were normal and 2-D echocardiogram was done on August 20, need to get echocardiogram report -Neurology consult is placed, notify Dr. Dr. Leotis Shames by the ED physician -PT evaluation  2. Frequent falls with intermittent episodes of altered mental status  Will get neuro checks PT evaluation Fall  precautions Will obtain orthostatics   3.? Hematuria as reported by the daughter Urinalysis with negative blood Will obtain urine culture and sensitivity If necessary will consider repeating urinalysis with culture and sensitivity  4. Chronic COPD-no exacerbation at this time Provide inhalers as needed basis as an  5. Essetial HTN The patient is denying any chest pain or headache or   Will provide GI prophylaxis DVT prophylaxis is not needed as the patient is on Eliquis    All the records are reviewed and case discussed with ED provider. Management  plans discussed with the patient, family and they are in agreement.  CODE STATUS: DO NOT RESUSCITATE, daughter is the healthcare power of attorney  TOTAL TIME TAKING CARE OF THIS PATIENT: 45 minutes.    Nicholes Mango M.D on 01/11/2015 at 2:31 PM  Between 7am to 6pm - Pager - [phone]  After 6pm go to [email or web address] - password [email or web address] [facility]  Office  [phone]  CC: Primary care physician; PROVIDER NOT IN SYSTEM

## 2015-01-11 NOTE — ED Notes (Signed)
Pt in MRI. Family waiting in room.

## 2015-01-11 NOTE — ED Notes (Addendum)
Reports having cva 3 wks ago.  States over the past 2 days she has been more confused and falling.  Reports urinary frequency last pm and noticed drops of blood.

## 2015-01-11 NOTE — Progress Notes (Signed)
Patient is active with [facility] skilled nursing, physical therapy, occupational therapy, speech therapy and social work services. Will need resumption orders at discharge if home health is still requested.

## 2015-01-11 NOTE — Plan of Care (Signed)
Problem: Discharge/Transitional Outcomes Goal: Barriers To Progression Addressed/Resolved Outcome: Progressing Individualiezation: 1. Pt had previous stroke approximately 3 weekds with new infarction/evolution. 2. Pt was living with 2 family members since discharge from hospital. 3. HIGH risk for fall-bed alarm, offer toileting every hour WA, protective footwear, keep personal items in hand reach. 4. Assist with activities regarding left arm/hand use; encourage activity and exericise as able. 5. HOH- uses hearing aide; LEFT arm precautions due to history left mastectomy. 6 PMH- HTN, CAD, DM, COPD, CHF- controlled by home meds.

## 2015-01-11 NOTE — ED Notes (Signed)
Pt presents with family, who state that she has "regressed" since her return home from hospital 3 weeks ago. They states she was "doing fine" until Friday and then she began falling and having urinary incontinence, as well as moments of altered mental status. They state she fell 2 x from Friday - Sunday, and at least 4 times since last night. They state that she will not stay in bed and every time she gets up, she falls. Family also reports that she is restless and has moments where she appears to be "absent" and then her mental status gets back to normal. Family concerned about their ability to care for her at this time. Pt has residual left sided weakness from CVA a few weeks ago. Pt alert & oriented at this time.

## 2015-01-11 NOTE — ED Provider Notes (Signed)
[facility] Emergency Department Provider Note  ____________________________________________  Time seen: Approximately 1937 PM  I have reviewed the triage vital signs and the nursing notes.   HISTORY  Chief Complaint Fall and Altered Mental Status    HPI Tanya Montgomery is a 74 y.o. female who is presenting today with increased weakness and confusion since this past Friday. She was recently diagnosed with a stroke in late August and was admitted to the hospital and subsequently discharged home. She is currently getting home physical therapy and is living with family. The family states that they're feeling overwhelmed and that the patient is needing constant supervision and assistance walking with her walker. The family says that the patient is becoming confused and trying to get out of bed without assistance which they have expressly told her not to do. She has had multiple falls. The patient is not complaining of any pain at this time. The family says that the patient is usually alert and oriented in the mornings from 8:30 to 11:30 AM and then progressively declines as the day progresses.   Past Medical History  Diagnosis Date  . HTN (hypertension)   . Heart attack   . Diabetes insipidus   . High cholesterol   . Kidney disease   . Sleep apnea   . Breast cancer 2000   . Heart murmur   . COPD (chronic obstructive pulmonary disease)   . GERD (gastroesophageal reflux disease)   . Thyroid nodule 2014  . Stroke     TIA  . Diabetes mellitus without complication   . CHF (congestive heart failure)   . Asthma     Patient Active Problem List   Diagnosis Date Noted  . CVA (cerebral infarction) 12/24/2014  . Solitary pulmonary nodule 03/16/2013  . Breast cancer   . Thyroid nodule   . Diabetes mellitus without complication   . CHF (congestive heart failure)   . Asthma   . Hypertension 03/20/2012  . Chronic cough 12/15/2010    Past Surgical History   Procedure Laterality Date  . Total abdominal hysterectomy    . Mastectomy  2001  . Appendectomy    . Carotid stent    . Tonsillectomy and adenoidectomy    . Coronary angioplasty  1998  . Parathyroidectomy Right 2002    Right inferior gland  . Breast surgery Left 2001  . Cholecystectomy      2004  . Nissen fundoplication N/A 9024    Current Outpatient Rx  Name  Route  Sig  Dispense  Refill  . apixaban (ELIQUIS) 5 MG TABS tablet   Oral   Take 1 tablet (5 mg total) by mouth 2 (two) times daily.   60 tablet   0     PLEASE STOP Aspirin when starting this. Patient sh ...   . atenolol (TENORMIN) 25 MG tablet   Oral   Take 25 mg by mouth 2 (two) times daily.         Marland Kitchen atorvastatin (LIPITOR) 40 MG tablet   Oral   Take 1 tablet (40 mg total) by mouth daily at 6 PM.   30 tablet   0   . BIOTIN PO   Oral   Take 1 capsule by mouth daily.         Marland Kitchen buPROPion (WELLBUTRIN SR) 150 MG 12 hr tablet   Oral   Take 150 mg by mouth 2 (two) times daily.         . Calcium  Citrate-Vitamin D (CALCIUM + D PO)   Oral   Take 1 tablet by mouth 2 (two) times daily.         . digoxin (LANOXIN) 0.25 MG tablet   Oral   Take 0.125-0.25 mg by mouth daily. Pt takes 0.125mg  on Monday, Wednesday, and Friday.   Pt takes 0.25mg  on Tuesday, Thursday, Saturday, and Sunday.         . esomeprazole (NEXIUM) 40 MG capsule   Oral   Take 40 mg by mouth 2 (two) times daily.          . furosemide (LASIX) 20 MG tablet   Oral   Take 20 mg by mouth daily.         Marland Kitchen glipiZIDE (GLUCOTROL XL) 10 MG 24 hr tablet   Oral   Take 10 mg by mouth daily.          . insulin aspart (NOVOLOG) 100 UNIT/ML injection   Subcutaneous   Inject 10 Units into the skin 3 (three) times daily before meals.         . insulin glargine (LANTUS) 100 UNIT/ML injection   Subcutaneous   Inject 50 Units into the skin at bedtime.          . isosorbide dinitrate (ISORDIL) 30 MG tablet   Oral   Take 45 mg by  mouth 2 (two) times daily.          . Multiple Vitamins-Minerals (MULTIVITAMIN WITH MINERALS) tablet   Oral   Take 1 tablet by mouth daily.           . naproxen sodium (ANAPROX) 220 MG tablet   Oral   Take 220 mg by mouth as needed.         . potassium chloride (KLOR-CON) 10 MEQ CR tablet   Oral   Take 10 mEq by mouth daily. Pt only takes four days per week.         . traMADol (ULTRAM) 50 MG tablet   Oral   Take 50 mg by mouth daily as needed.         . valACYclovir (VALTREX) 1000 MG tablet   Oral   Take 1,000 mg by mouth daily.         . valsartan (DIOVAN) 160 MG tablet   Oral   Take 160 mg by mouth daily.           Marland Kitchen venlafaxine (EFFEXOR) 75 MG tablet   Oral   Take 75 mg by mouth 2 (two) times daily.           Marland Kitchen aspirin EC 81 MG tablet   Oral   Take 1 tablet (81 mg total) by mouth daily.   5 tablet   0     Take asa only for 5 days then start eliquis and ST ...   . omega-3 acid ethyl esters (LOVAZA) 1 G capsule   Oral   Take 2 g by mouth 2 (two) times daily.           Allergies Levaquin  Family History  Problem Relation Age of Onset  . Emphysema Father   . Lung cancer Father   . Cancer Father     Thyroid  . Heart disease Mother     died age 53  . Brain cancer Son     died age 43    Social History Social History  Substance Use Topics  . Smoking status: Never Smoker   . Smokeless tobacco:  Never Used  . Alcohol Use: No     Comment: Occasional drinker    Review of Systems Constitutional: No fever/chills Eyes: No visual changes. ENT: No sore throat. Cardiovascular: Denies chest pain. Respiratory: Denies shortness of breath. Gastrointestinal: No abdominal pain.  No nausea, no vomiting.  No diarrhea.  No constipation. Genitourinary: Negative for dysuria. Musculoskeletal: Negative for back pain. Skin: Negative for rash. Neurological: Negative for headaches, or numbness.  10-point ROS otherwise  negative.  ____________________________________________   PHYSICAL EXAM:  VITAL SIGNS: ED Triage Vitals  Enc Vitals Group     BP 01/11/15 0908 169/92 mmHg     Pulse Rate 01/11/15 0908 78     Resp 01/11/15 0908 17     Temp 01/11/15 0908 98.9 F (37.2 C)     Temp Source 01/11/15 0908 Oral     SpO2 01/11/15 0908 95 %     Weight 01/11/15 0908 147 lb (66.679 kg)     Height 01/11/15 0908 5\' 2"  (1.575 m)     Head Cir --      Peak Flow --      Pain Score --      Pain Loc --      Pain Edu? --      Excl. in [place]? --     Constitutional: Alert and oriented. Well appearing and in no acute distress. Eyes: Conjunctivae are normal. PERRL. EOMI. Head: Atraumatic. Nose: No congestion/rhinnorhea. Mouth/Throat: Mucous membranes are moist.  Oropharynx non-erythematous. Neck: No stridor.   Cardiovascular: Normal rate, regular rhythm. Grossly normal heart sounds.  Good peripheral circulation. Respiratory: Normal respiratory effort.  No retractions. Lungs CTAB. Gastrointestinal: Soft and nontender. No distention. No abdominal bruits. No CVA tenderness. Musculoskeletal: No lower extremity tenderness nor edema.  No joint effusions. Neurologic:  Normal speech and language. Strong 4-5 to left upper and lower extremities which the family says is her baseline since the stroke. Also with a left-sided facial droop which is his also been present since the stroke and has improved. She is able to walk with assistance. Skin:  Skin is warm, dry and intact. No rash noted. Psychiatric: Mood and affect are normal. Speech and behavior are normal.  ____________________________________________   LABS (all labs ordered are listed, but only abnormal results are displayed)  Labs Reviewed  URINALYSIS COMPLETEWITH MICROSCOPIC ([facility] ONLY) - Abnormal; Notable for the following:    Color, Urine AMBER (*)    APPearance CLOUDY (*)    Protein, ur 30 (*)    Bacteria, UA MANY (*)    All other components within normal  limits  CBC WITH DIFFERENTIAL/PLATELET - Abnormal; Notable for the following:    RDW 15.3 (*)    Platelets 123 (*)    Lymphs Abs 0.7 (*)    All other components within normal limits  COMPREHENSIVE METABOLIC PANEL - Abnormal; Notable for the following:    Chloride 100 (*)    Glucose, Bld 242 (*)    All other components within normal limits  TROPONIN I   ____________________________________________  EKG  ED ECG REPORT I, Doran Stabler, the attending physician, personally viewed and interpreted this ECG.   Date: 01/11/2015  EKG Time: 1104  Rate: 71  Rhythm: normal sinus rhythm with PVC times one.  Axis: Normal axis  Intervals:none  ST&T Change: No ST segment elevations. No abnormal T-wave inversions. No significant changes from previous EKGs.     ____________________________________________  RADIOLOGY  Chest x-ray with cardiomegaly without acute disease. I personally  reviewed these films. CT of the head with expected evolution of the right parietal infarct. No hemorrhagic transformation.   ____________________________________________   PROCEDURES   ____________________________________________   INITIAL IMPRESSION / ASSESSMENT AND PLAN / ED COURSE  Pertinent labs & imaging results that were available during my care of the patient were reviewed by me and considered in my medical decision making (see chart for details).  ----------------------------------------- 1:34 PM on 01/11/2015 -----------------------------------------  Patient while in the emergency department unable to chew food. Worsening baseline. Discussed the case with Dr. Irish Elders who recommends diffusion, ADC MRI. Patient with worsening status as well as difficulty swallowing at this time. We'll admit to the hospital for neurologic evaluation and likely placement in acute rehabilitation. Signed out to Dr.Gouru.   ____________________________________________   FINAL CLINICAL IMPRESSION(S) / ED  DIAGNOSES  Acute on chronic CVA. Return visit.    Orbie Pyo, MD 01/11/15 [phone]

## 2015-01-12 DIAGNOSIS — I639 Cerebral infarction, unspecified: Principal | ICD-10-CM

## 2015-01-12 LAB — GLUCOSE, CAPILLARY
GLUCOSE-CAPILLARY: 245 mg/dL — AB (ref 65–99)
GLUCOSE-CAPILLARY: 98 mg/dL (ref 65–99)
Glucose-Capillary: 218 mg/dL — ABNORMAL HIGH (ref 65–99)
Glucose-Capillary: 221 mg/dL — ABNORMAL HIGH (ref 65–99)

## 2015-01-12 LAB — CLOSTRIDIUM DIFFICILE BY PCR: CDIFFPCR: POSITIVE — AB

## 2015-01-12 MED ORDER — HYDROCORTISONE 1 % EX CREA
TOPICAL_CREAM | Freq: Two times a day (BID) | CUTANEOUS | Status: AC
  Administered 2015-01-12: 20:00:00 via TOPICAL
  Filled 2015-01-12 (×2): qty 28

## 2015-01-12 MED ORDER — ASPIRIN 81 MG PO CHEW
81.0000 mg | CHEWABLE_TABLET | Freq: Every day | ORAL | Status: DC
  Administered 2015-01-12 – 2015-01-14 (×3): 81 mg via ORAL
  Filled 2015-01-12 (×2): qty 1

## 2015-01-12 MED ORDER — HYDROCORTISONE ACETATE 25 MG RE SUPP
25.0000 mg | Freq: Two times a day (BID) | RECTAL | Status: DC | PRN
  Administered 2015-01-12: 02:00:00 25 mg via RECTAL
  Filled 2015-01-12 (×2): qty 1

## 2015-01-12 NOTE — Evaluation (Signed)
Physical Therapy Evaluation Patient Details Name: Tanya Montgomery MRN: [id number] DOB: October 21, 1940 Today's Date: 01/12/2015   History of Present Illness  Pt is a 74 y.o. female with recent admission for CVA about 3 weeks ago and re-admitted for new CVA.  CT head:  expected evolution of R parietal infarct with cortical laminar necrosis.  MRI head:  small acute to early subacute infarct in posterior R frontal lobe.  PMH includes:  chronic L shoulder problems, breast CA, COPD, htn, MI, stroke.  Clinical Impression  Currently pt demonstrates impairments with strength, balance, activity tolerance, and limitations with functional mobility.  Since pt's last stroke about 3 weeks ago, pt has required increased assist from baseline and family has been providing that at pt's daughter's home but pt has fallen multiple times (usually when family is not with pt although pt's daughter reports someone is always around in the home).  Currently pt is min assist with bed mobility, transfers, and taking a few steps with RW but fatigues quickly.  Pt also has difficulty holding onto RW with L UE d/t significant weakness and anticipate pt would benefit from trial of a different AD.  Pt would benefit from skilled PT to address above noted impairments and functional limitations.  Recommend pt discharge to STR when medically appropriate.     Follow Up Recommendations SNF    Equipment Recommendations  Other (comment)    Recommendations for Other Services       Precautions / Restrictions Precautions Precautions: Fall Restrictions Weight Bearing Restrictions: No      Mobility  Bed Mobility Overal bed mobility: Needs Assistance Bed Mobility: Supine to Sit;Sit to Supine     Supine to sit: Min guard;Min assist;HOB elevated Sit to supine: Supervision;Min guard;HOB elevated      Transfers Overall transfer level: Needs assistance Equipment used: Rolling walker (2 wheeled) Transfers: Sit to/from Stand Sit  to Stand: Min guard;Min assist         General transfer comment: increased assist with repetition (x3 trials)  Ambulation/Gait Ambulation/Gait assistance: Min assist Ambulation Distance (Feet):  (marching in place) Assistive device: Rolling walker (2 wheeled)       General Gait Details: pt marched in place x10 reps B LE's (pt fatigued after this); decreased B foot clearance  Stairs            Wheelchair Mobility    Modified Rankin (Stroke Patients Only)       Balance Overall balance assessment: History of Falls;Needs assistance Sitting-balance support: Bilateral upper extremity supported;Feet supported Sitting balance-Leahy Scale: Good     Standing balance support: Bilateral upper extremity supported (on RW) Standing balance-Leahy Scale: Fair                               Pertinent Vitals/Pain Pain Assessment: 0-10 Pain Score: 4  Pain Location: R lower lateral back pain Pain Intervention(s): Limited activity within patient's tolerance;Monitored during session;Repositioned  Vitals stable and WFL throughout treatment session.    Home Living Family/patient expects to be discharged to:: Skilled nursing facility Living Arrangements: Children (Pt's daughter since last stroke) Available Help at Discharge: Family Type of Home: House Home Access:  (2 short steps to enter)     Home Layout: One level Home Equipment: Cane - single point;Walker - 2 wheels;Shower seat (has commode ordered from [employer] (to come later this week))      Prior Function Level of Independence: Needs assistance  Gait / Transfers Assistance Needed: uses RW; multiple falls since last discharge from hospital (usually when pt is trying to mobilize on her own)     Comments: pt has had someone around at daughter's home to assist pt as needed since discharge from hospital from last stroke     Hand Dominance        Extremity/Trunk Assessment   Upper Extremity  Assessment: RUE deficits/detail;LUE deficits/detail RUE Deficits / Details: ROM and strength WFL     LUE Deficits / Details: chronic L shoulder issues (for last 3 years); decreased L grip strength; L shoulder flexion 2-/5; L elbow flexion/extension 3+/5   Lower Extremity Assessment: RLE deficits/detail;LLE deficits/detail RLE Deficits / Details: R LE ROM and strength WFL LLE Deficits / Details: L hip flexion 4+/5; L knee flexion/extension 4/5; L DF 4+/5     Communication   Communication: HOH  Cognition Arousal/Alertness: Awake/alert Behavior During Therapy: WFL for tasks assessed/performed Overall Cognitive Status: Within Functional Limits for tasks assessed                      General Comments   Nursing cleared pt for participation in physical therapy.  Pt agreeable to PT session. Pt's daughter present most of session.    Exercises        Assessment/Plan    PT Assessment Patient needs continued PT services  PT Diagnosis Difficulty walking;Abnormality of gait   PT Problem List Decreased activity tolerance;Decreased balance;Decreased mobility;Pain  PT Treatment Interventions DME instruction;Gait training;Stair training;Functional mobility training;Therapeutic activities;Therapeutic exercise;Balance training;Patient/family education   PT Goals (Current goals can be found in the Care Plan section) Acute Rehab PT Goals Patient Stated Goal: to be able to transfer to toilet herself PT Goal Formulation: With patient/family Time For Goal Achievement: 01/26/15 Potential to Achieve Goals: Good    Frequency 7X/week   Barriers to discharge Decreased caregiver support      Co-evaluation               End of Session Equipment Utilized During Treatment: Gait belt Activity Tolerance: Patient limited by fatigue Patient left: in bed;with call bell/phone within reach;with bed alarm set Nurse Communication: Mobility status         Time: 1053-1130 PT Time  Calculation (min) (ACUTE ONLY): 37 min   Charges:   PT Evaluation $Initial PT Evaluation Tier I: 1 Procedure PT Treatments $Therapeutic Activity: 8-22 mins   PT G CodesLeitha Bleak 17-Jan-2015, 12:58 PM Leitha Bleak, [place]

## 2015-01-12 NOTE — Care Management (Signed)
Admitted to [facility] with the diagnosis of weakness. Discharged from this facility 12/25/14 with the diagnosis of cerebral infarction. Lives alone. Daughter is Knute Neu [phone]). No skilled facility. No home oxygen. Uses a rolling walker to aid in ambulation. Takes care of all activities of daily living in the home. Daughter takes care of instrumental needs. Life Alert is present in the home. Seen Dr. Lisette Grinder 1 week after last discharge. Daughter will transport. Shelbie Ammons RN MSN Care Management [phone]

## 2015-01-12 NOTE — Progress Notes (Signed)
Initial Nutrition Assessment   INTERVENTION:   Coordination of Care: await diet progression as able s/p Swallow study; will follow poc   NUTRITION DIAGNOSIS:   Inadequate oral intake related to inability to eat as evidenced by NPO status.  GOAL:   Patient will meet greater than or equal to 90% of their needs  MONITOR:    (Energy intake, Digestive system, Anthropometerics, Glucose Profile)  REASON FOR ASSESSMENT:   Malnutrition Screening Tool    ASSESSMENT:   Pt admitted with left sided weakness secondary to new acute CVA. Per MD note pt with stroke 3 weeks ago as well. RD attempted to see patient multiple times today, however pt with MD on rounds and other disciplines and unavailable.  Past Medical History  Diagnosis Date  . HTN (hypertension)   . Heart attack   . Diabetes insipidus   . High cholesterol   . Kidney disease   . Sleep apnea   . Breast cancer 2000   . Heart murmur   . COPD (chronic obstructive pulmonary disease)   . GERD (gastroesophageal reflux disease)   . Thyroid nodule 2014  . Stroke     TIA  . Diabetes mellitus without complication   . CHF (congestive heart failure)   . Asthma     Diet Order:  Diet NPO time specified Except for: Sips with Meds    Current Nutrition: Pt NPO  Food/Nutrition-Related History: Per MST no decrease in appetite PTA.   Medications: novolog, omega-3  Electrolyte/Renal Profile and Glucose Profile:   Recent Labs Lab 01/11/15 0919  NA 137  K 3.6  CL 100*  CO2 28  BUN 11  CREATININE 0.77  CALCIUM 8.9  GLUCOSE 242*   Protein Profile:  Recent Labs Lab 01/11/15 0919  ALBUMIN 3.5    Gastrointestinal Profile: Last BM: multiple loose stools this am documented   Nutrition-Focused Physical Exam Findings:  Unable to complete Nutrition-Focused physical exam at this time.    Weight Change: Per MST pt unsure of weight trend. Per CHL weight has been stable.  Skin:  Reviewed, no issues  Last BM:      Height:   Ht Readings from Last 1 Encounters:  01/11/15 5\' 2"  (1.575 m)    Weight:   Wt Readings from Last 1 Encounters:  01/11/15 149 lb (67.586 kg)    Wt Readings from Last 10 Encounters:  01/11/15 149 lb (67.586 kg)  12/24/14 144 lb 8 oz (65.545 kg)  06/16/13 146 lb 3.2 oz (66.316 kg)  03/16/13 145 lb (65.772 kg)  01/20/13 142 lb 9.6 oz (64.683 kg)  10/20/12 142 lb (64.411 kg)  09/08/12 149 lb (67.586 kg)  06/12/12 153 lb (69.4 kg)  04/14/12 150 lb (68.04 kg)  03/19/12 145 lb 12.8 oz (66.134 kg)    BMI:  Body mass index is 27.25 kg/(m^2).  Estimated Nutritional Needs:   Kcal:  BEE: 1125kcals, TEE: (IF 1.1-1.3)(AF 1.2) 1485-1754kcals  Protein:  54-68g protein (0.8-1.0g/kg)  Fluid:  1690-2022mL of fluid (25-26mL/kg)  EDUCATION NEEDS:   Education needs no appropriate at this time    [place], RD, LDN Pager [phone]

## 2015-01-12 NOTE — Plan of Care (Signed)
Problem: SLP Dysphagia Goals Goal: Patient will demonstrate readiness for PO's Patient will demonstrate readiness for PO's and/or instrumental swallow study as evidenced by: completion of the MBSS indicating least restrictive po diet.

## 2015-01-12 NOTE — Plan of Care (Signed)
Problem: Discharge/Transitional Outcomes Goal: Independent mobility/functioning independent or with min Independent mobility/functioning independently or with minimal assistance  Outcome: Progressing Pt worked with PT today, they recommend and SNF for rehab. Pt is weak on left side from previous stroke. Ambulated to chair from bed for RN, awaiting swallow study.

## 2015-01-12 NOTE — Clinical Social Work Placement (Signed)
   CLINICAL SOCIAL WORK PLACEMENT  NOTE  Date:  01/12/2015  Patient Details  Name: Tanya Montgomery MRN: [id number] Date of Birth: 1941/03/15  Clinical Social Work is seeking post-discharge placement for this patient at the [place] level of care (*CSW will initial, date and re-position this form in  chart as items are completed):  Yes   Patient/family provided with [facility] Work Department's list of facilities offering this level of care within the geographic area requested by the patient (or if unable, by the patient's family).  Yes   Patient/family informed of their freedom to choose among providers that offer the needed level of care, that participate in Medicare, Medicaid or managed care program needed by the patient, have an available bed and are willing to accept the patient.  Yes   Patient/family informed of [facility]'s ownership interest in [facility] and [facility] as well as of the fact that they are under no obligation to receive care at these facilities.  PASRR submitted to EDS on 01/12/15     PASRR number received on 01/12/15     Existing PASRR number confirmed on       FL2 transmitted to all facilities in geographic area requested by pt/family on 01/12/15     FL2 transmitted to all facilities within larger geographic area on       Patient informed that his/her managed care company has contracts with or will negotiate with certain facilities, including the following:            Patient/family informed of bed offers received.  Patient chooses bed at       Physician recommends and patient chooses bed at      Patient to be transferred to   on  .  Patient to be transferred to facility by       Patient family notified on   of transfer.  Name of family member notified:        PHYSICIAN Please sign FL2, Please sign DNR     Additional Comment:    _______________________________________________ Loralyn Freshwater, LCSW 01/12/2015, 2:34 PM

## 2015-01-12 NOTE — Progress Notes (Signed)
Patient up to bsc, Patient complaining of stomach pain, Nurse performed swallow test at bedside, patient swallowed applesauce and sips of water without difficulty, sit her up high in bed. Nurse administered tramadol for pain and also digoxin, and eliquis without difficulty

## 2015-01-12 NOTE — Progress Notes (Signed)
[place] [facility] [place] NAME: Tanya Montgomery    MR#:  [id number]  DATE OF BIRTH:  Aug 13, 1940  SUBJECTIVE:  CHIEF COMPLAINT:   Chief Complaint  Patient presents with  . Fall  . Altered Mental Status  shaky and weak, lt numbness. Daughter at bedside concerned as patient has had multiple falls recently REVIEW OF SYSTEMS:  Review of Systems  Constitutional: Negative for fever, weight loss, malaise/fatigue and diaphoresis.  HENT: Negative for ear discharge, ear pain, hearing loss, nosebleeds, sore throat and tinnitus.   Eyes: Negative for blurred vision and pain.  Respiratory: Negative for cough, hemoptysis, shortness of breath and wheezing.   Cardiovascular: Negative for chest pain, palpitations, orthopnea and leg swelling.  Gastrointestinal: Negative for heartburn, nausea, vomiting, abdominal pain, diarrhea, constipation and blood in stool.  Genitourinary: Negative for dysuria, urgency and frequency.  Musculoskeletal: Negative for myalgias and back pain.  Skin: Negative for itching and rash.  Neurological: Positive for sensory change and focal weakness. Negative for dizziness, tingling, tremors, seizures, weakness and headaches.  Psychiatric/Behavioral: Negative for depression. The patient is not nervous/anxious.    DRUG ALLERGIES:   Allergies  Allergen Reactions  . Levaquin [Levofloxacin In D5w] Other (See Comments)    Reaction:  Tachycardia   VITALS:  Blood pressure 143/72, pulse 72, temperature 97.7 F (36.5 C), temperature source Oral, resp. rate 18, height 5\' 2"  (1.575 m), weight 67.586 kg (149 lb), SpO2 93 %. PHYSICAL EXAMINATION:  Physical Exam  Constitutional: She is oriented to person, place, and time and well-developed, well-nourished, and in no distress.  HENT:  Head: Normocephalic and atraumatic.  Eyes: Conjunctivae and EOM are normal. Pupils are equal, round, and reactive to light.  Neck: Normal range of  motion. Neck supple. No tracheal deviation present. No thyromegaly present.  Cardiovascular: Normal rate, regular rhythm and normal heart sounds.   Pulmonary/Chest: Effort normal and breath sounds normal. No respiratory distress. She has no wheezes. She exhibits no tenderness.  Abdominal: Soft. Bowel sounds are normal. She exhibits no distension. There is no tenderness.  Musculoskeletal: Normal range of motion.  Neurological: She is alert and oriented to person, place, and time. She displays weakness (lt > rt) and facial asymmetry. A sensory deficit is present. No cranial nerve deficit.  Skin: Skin is warm and dry. No rash noted.  Psychiatric: Mood and affect normal.   LABORATORY PANEL:   CBC  Recent Labs Lab 01/11/15 0919  WBC 6.3  HGB 13.3  HCT 39.5  PLT 123*   ------------------------------------------------------------------------------------------------------------------ Chemistries   Recent Labs Lab 01/11/15 0919  NA 137  K 3.6  CL 100*  CO2 28  GLUCOSE 242*  BUN 11  CREATININE 0.77  CALCIUM 8.9  AST 32  ALT 32  ALKPHOS 69  BILITOT 1.0   RADIOLOGY:  No results found. ASSESSMENT AND PLAN:    1. Acute CVA: MRI shows new R frontal lobe infarct - continue Eliquis 5 mg by mouth twice a day per Neuro - will need rehab  2. Frequent falls with intermittent episodes of altered mental status - SNF placement   3.? Hematuria as reported by the daughter Urinalysis with negative blood Will obtain urine culture and sensitivity If necessary will consider repeating urinalysis with culture and sensitivity  4. Chronic COPD-no exacerbation at this time Provide inhalers as needed basis as an  5. Essetial HTN The patient is denying any chest pain or headache   Had long d/w daughter about  her living will and code status - she feels she is unable to manage her at home with so many falls recently and she is not able to sleep. With her and her husband's job it's hard to  manage everything. She is wanting to explore possible Hospice option if patinet qualifies. Will REQUEST palliative care c/s  All the records are reviewed and case discussed with Care Management/Social Worker. Management plans discussed with the patient, family and they are in agreement.  CODE STATUS: DNR  TOTAL TIME TAKING CARE OF THIS PATIENT: 35 minutes.   More than 50% of the time was spent in counseling/coordination of care: YES  POSSIBLE D/C IN 1-2 DAYS, DEPENDING ON CLINICAL CONDITION.   [facility], Onofrio Klemp M.D on 01/12/2015 at 2:40 PM  Between 7am to 6pm - Pager - [phone]  After 6pm go to [email or web address] - password [email or web address] [facility]  Office  [phone]  CC:  Primary care physician; PROVIDER NOT IN SYSTEM

## 2015-01-12 NOTE — Clinical Social Work Note (Signed)
Clinical Social Work Assessment  Patient Details  Name: Tanya Montgomery MRN: [id number] Date of Birth: [birth year]  Date of referral:  01/12/15               Reason for consult:  Facility Placement                Permission sought to share information with:  Chartered certified accountant granted to share information::  Yes, Verbal Permission Granted  Name::      Tanya Montgomery::   Tanya Montgomery   Relationship::     Contact Information:     Housing/Transportation Living arrangements for the past 2 months:  [place] of Information:  Patient, Adult Children Patient Interpreter Needed:  None Criminal Activity/Legal Involvement Pertinent to Current Situation/Hospitalization:  No - Comment as needed Significant Relationships:  Adult Children Lives with:  Adult Children Do you feel safe going back to the place where you live?  Yes Need for family participation in patient care:  Yes (Comment)  Care giving concerns: Patient lives with her daughter Tanya Montgomery [phone].    Social Worker assessment / plan: [place] representative (CSW) received verbal consult from RN Case Manager that patient is a New SNF. PT is recommending SNF. CSW met with patient and her daughter Tanya Montgomery and her niece were at bedside. CSW introduced self and explained role of CSW department. Patient reported that she was living alone in [place] until she had a stroke a month ago. Patient reported that she is now staying with her daughter Tanya Montgomery in [place]. CSW explained SNF process to patient and daughter. CSW explained that patient's Tanya Montgomery will have to approve SNF stay. CSW also explained long term care options including private pay and Medicaid. CSW also gave daughter [facility] worker) phone number. Patient and daughter are agreeable to SNF search in [facility] and prefer [facility] and [employer]   FL2 complete and on chart. CSW also  contacted Tanya Montgomery Case Manager and made her aware of above. CSW faxed clinicals to [facility].   Employment status:  Retired Nurse, adult PT Recommendations:  [place] / Referral to community resources:  [place]  Patient/Family's Response to care: Patient and daughter are agreeable to SNF search in [place].   Patient/Family's Understanding of and Emotional Response to Diagnosis, Current Treatment, and Prognosis: Patient was pleasant throughout assessment and thanked CSW for visit.   Emotional Assessment Appearance:  Appears stated age Attitude/Demeanor/Rapport:    Affect (typically observed):  Accepting, Adaptable, Pleasant Orientation:  Oriented to Self, Oriented to Place, Oriented to  Time, Oriented to Situation Alcohol / Substance use:  Not Applicable Psych involvement (Current and /or in the community):  No (Comment)  Discharge Needs  Concerns to be addressed:  Discharge Planning Concerns Readmission within the last 30 days:  No Current discharge risk:  Dependent with Mobility Barriers to Discharge:  Continued Medical Work up   Tanya Freshwater, LCSW 01/12/2015, 2:36 PM

## 2015-01-12 NOTE — Consult Note (Signed)
CC: L sided numbness   HPI: Tanya Montgomery is an 74 y.o. female known to my service with R sided weakness from stroke a month ago, A fib on elaquis that was started come in with new L sided numbness  MRI shows new R frontal lobe infarct  Past Medical History  Diagnosis Date  . HTN (hypertension)   . Heart attack   . Diabetes insipidus   . High cholesterol   . Kidney disease   . Sleep apnea   . Breast cancer 2000   . Heart murmur   . COPD (chronic obstructive pulmonary disease)   . GERD (gastroesophageal reflux disease)   . Thyroid nodule 2014  . Stroke     TIA  . Diabetes mellitus without complication   . CHF (congestive heart failure)   . Asthma     Past Surgical History  Procedure Laterality Date  . Total abdominal hysterectomy    . Mastectomy  2001  . Appendectomy    . Carotid stent    . Tonsillectomy and adenoidectomy    . Coronary angioplasty  1998  . Parathyroidectomy Right 2002    Right inferior gland  . Breast surgery Left 2001  . Cholecystectomy      2004  . Nissen fundoplication N/A 6144    Family History  Problem Relation Age of Onset  . Emphysema Father   . Lung cancer Father   . Cancer Father     Thyroid  . Heart disease Mother     died age 82  . Brain cancer Son     died age 61    Social History:  reports that she has never smoked. She has never used smokeless tobacco. She reports that she does not drink alcohol or use illicit drugs.  Allergies  Allergen Reactions  . Levaquin [Levofloxacin In D5w] Other (See Comments)    Reaction:  Tachycardia    Medications: I have reviewed the patient's current medications.  ROS: History obtained from the patient  General ROS: negative for - chills, fatigue, fever, night sweats, weight gain or weight loss Psychological ROS: negative for - behavioral disorder, hallucinations, memory difficulties, mood swings or suicidal ideation Ophthalmic ROS: negative for - blurry vision, double vision,  eye pain or loss of vision ENT ROS: negative for - epistaxis, nasal discharge, oral lesions, sore throat, tinnitus or vertigo Allergy and Immunology ROS: negative for - hives or itchy/watery eyes Hematological and Lymphatic ROS: negative for - bleeding problems, bruising or swollen lymph nodes Endocrine ROS: negative for - galactorrhea, hair pattern changes, polydipsia/polyuria or temperature intolerance Respiratory ROS: negative for - cough, hemoptysis, shortness of breath or wheezing Cardiovascular ROS: negative for - chest pain, dyspnea on exertion, edema or irregular heartbeat Gastrointestinal ROS: negative for - abdominal pain, diarrhea, hematemesis, nausea/vomiting or stool incontinence Genito-Urinary ROS: negative for - dysuria, hematuria, incontinence or urinary frequency/urgency Musculoskeletal ROS: negative for - joint swelling or muscular weakness Neurological ROS: as noted in HPI Dermatological ROS: negative for rash and skin lesion changes  Physical Examination: Blood pressure 160/80, pulse 78, temperature 98 F (36.7 C), temperature source Oral, resp. rate 18, height 5\' 2"  (1.575 m), weight 67.586 kg (149 lb), SpO2 96 %.  HEENT-  Normocephalic, no lesions, without obvious abnormality.  Normal external eye and conjunctiva.  Normal TM's bilaterally.  Normal auditory canals and external ears. Normal external nose, mucus membranes and septum.  Normal pharynx.   Neurological Examination Mental Status: Alert, oriented, thought content appropriate.  Speech fluent without evidence of aphasia.  Able to follow 3 step commands without difficulty. Cranial Nerves: II: Discs flat bilaterally; Visual fields grossly normal, pupils equal, round, reactive to light and accommodation III,IV, VI: ptosis not present, extra-ocular motions intact bilaterally V,VII: smile symmetric, facial light touch sensation normal bilaterally VIII: hearing normal bilaterally IX,X: gag reflex present XI:  bilateral shoulder shrug XII: midline tongue extension Motor: Right : Upper extremity   5/5    Left:     Upper extremity   3/5  Lower extremity   5/5     Lower extremity   4/5 Tone and bulk:normal tone throughout; no atrophy noted Sensory: decreased sensation LUE and LLE to touch Deep Tendon Reflexes: 1+ and symmetric throughout Plantars: Right: downgoing   Left: downgoing Cerebellar: normal finger-to-nose, normal rapid alternating movements and normal heel-to-shin test Gait: not tested       Laboratory Studies:   Basic Metabolic Panel:  Recent Labs Lab 01/11/15 0919  NA 137  K 3.6  CL 100*  CO2 28  GLUCOSE 242*  BUN 11  CREATININE 0.77  CALCIUM 8.9    Liver Function Tests:  Recent Labs Lab 01/11/15 0919  AST 32  ALT 32  ALKPHOS 69  BILITOT 1.0  PROT 6.5  ALBUMIN 3.5   No results for input(s): LIPASE, AMYLASE in the last 168 hours. No results for input(s): AMMONIA in the last 168 hours.  CBC:  Recent Labs Lab 01/11/15 0919  WBC 6.3  NEUTROABS 5.0  HGB 13.3  HCT 39.5  MCV 87.1  PLT 123*    Cardiac Enzymes:  Recent Labs Lab 01/11/15 0919  TROPONINI <0.03    BNP: Invalid input(s): POCBNP  CBG:  Recent Labs Lab 01/11/15 1722 01/11/15 2118 01/12/15 0712 01/12/15 1158  GLUCAP 188* 173* 245* 218*    Microbiology: Results for orders placed or performed during the hospital encounter of 01/11/15  Urine culture     Status: None (Preliminary result)   Collection Time: 01/11/15 12:00 PM  Result Value Ref Range Status   Specimen Description URINE, RANDOM  Final   Special Requests Normal  Final   Culture NO GROWTH < 24 HOURS  Final   Report Status PENDING  Incomplete    Coagulation Studies: No results for input(s): LABPROT, INR in the last 72 hours.  Urinalysis:  Recent Labs Lab 01/11/15 1200  COLORURINE AMBER*  LABSPEC 1.013  PHURINE 5.0  GLUCOSEU NEGATIVE  HGBUR NEGATIVE  BILIRUBINUR NEGATIVE  KETONESUR NEGATIVE  PROTEINUR  30*  NITRITE NEGATIVE  LEUKOCYTESUR NEGATIVE    Lipid Panel:     Component Value Date/Time   CHOL 270* 12/25/2014 0453   CHOL 184 08/25/2011 0046   TRIG 314* 12/25/2014 0453   TRIG 469* 08/25/2011 0046   HDL 27* 12/25/2014 0453   HDL 31* 08/25/2011 0046   CHOLHDL 10.0 12/25/2014 0453   VLDL 63* 12/25/2014 0453   VLDL SEE COMMENT 08/25/2011 0046   LDLCALC 180* 12/25/2014 0453   LDLCALC SEE COMMENT 08/25/2011 0046    HgbA1C:  Lab Results  Component Value Date   HGBA1C 10.7* 12/25/2014    Urine Drug Screen:  No results found for: LABOPIA, COCAINSCRNUR, LABBENZ, AMPHETMU, THCU, LABBARB  Alcohol Level: No results for input(s): ETH in the last 168 hours.  Other results: EKG: atrial fibrillation, rate 70.  Imaging: Dg Chest 1 View  01/11/2015   CLINICAL DATA:  Recurrent falls and varying prominence since 01/07/2015. Intermittent altered mental status.  EXAM: CHEST  1 VIEW  COMPARISON:  PA and lateral chest 03/16/2013 and CT chest 01/29/2013.  FINDINGS: The patient is status post left mastectomy and axillary dissection. The lungs appear clear. Heart size is enlarged. There is no pneumothorax or pleural effusion. 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  IMPRESSION: Cardiomegaly without acute disease.   Electronically Signed   By: Inge Rise M.D.   On: 01/11/2015 11:36   Ct Head Wo Contrast  01/11/2015   CLINICAL DATA:  Altered mental status. CVA 3 weeks ago. Confusion with falling. Urinary frequency.  EXAM: CT HEAD WITHOUT CONTRAST  TECHNIQUE: Contiguous axial images were obtained from the base of the skull through the vertex without intravenous contrast.  COMPARISON:  12/25/2014 MRI.  FINDINGS: Expected evolution of RIGHT parietal infarct and other smaller lacunar infarcts seen on prior MRI. Cortical laminar necrosis is present in the RIGHT parietal lobe. No hemorrhagic transformation is present. Dilated perivascular spaces and old lacunar infarcts are present. These  appear similar to the prior MRI. Atrophy and chronic ischemic white matter disease appears similar. The calvarium is intact. Mastoid air cells clear. Intracranial atherosclerosis.  IMPRESSION: 1. Expected evolution of RIGHT parietal infarct with cortical laminar necrosis. No hemorrhagic transformation. 2. Atrophy and chronic ischemic white matter disease. Scattered lacunar infarcts and dilated perivascular spaces. 3. No acute intracranial abnormality.   Electronically Signed   By: Dereck Ligas M.D.   On: 01/11/2015 09:39   Mr Brain Wo Contrast  01/11/2015   CLINICAL DATA:  Worsening left-sided weakness. Worsening confusion with multiple falls. Right cerebral hemispheric strokes in 12/2014.  EXAM: MRI HEAD WITHOUT CONTRAST  TECHNIQUE: Multiplanar, multiecho pulse sequences of the brain and surrounding structures were obtained without intravenous contrast.  COMPARISON:  Head CT 01/11/2015 and MRI 12/25/2014  FINDINGS: Acute infarcts on the prior MRI demonstrate expected interval evolution with evidence of some laminar necrosis and petechial blood products, and with the largest confluent area of infarction again noted involving right parietal lobe. There is a small area of acute to early subacute infarction measuring 2 cm in size in the posterior right frontal lobe involving predominantly subcortical white matter which is new from the prior MRI. No other new infarcts are identified.  There is no evidence of mass, midline shift, or extra-axial fluid collection. Mild generalized cerebral atrophy is unchanged. A small, chronic cortical/subcortical infarct in the left parietal lobe is unchanged. Patchy T2 hyperintensities throughout the subcortical and deep cerebral white matter and pons are unchanged and compatible with moderate chronic small vessel ischemic disease. Dilated perivascular spaces are noted at the inferior aspects of the lentiform nuclei. Chronic lacunar infarcts are present in the pons, cerebellum,  thalami, and right internal capsule.  Prior bilateral cataract extraction is noted. Paranasal sinuses and mastoid air cells are clear. Major intracranial vascular flow voids are preserved.  IMPRESSION: 1. Small, acute to early subacute infarct in the posterior right frontal lobe, new from the prior MRI. 2. Expected evolution of other previously seen right MCA territory infarcts. 3. Chronic small vessel ischemic changes as above.   Electronically Signed   By: Logan Bores M.D.   On: 01/11/2015 15:10     Assessment/Plan:  74 y.o. female known to my service with R sided weakness from stroke a month ago, A fib on elaquis that was started come in with new L sided numbness  MRI shows new R frontal lobe infarct  Numbness slightly improved Will need rehab Cont elaquis D/w family at bedside.  Diabetic management 01/12/2015, 1:24 PM

## 2015-01-12 NOTE — Progress Notes (Signed)
Pt up to chair today, worked with pt. She needs skilled rehab, according to the assesment done by PT. NIH is 8 , no new deficits.

## 2015-01-12 NOTE — Evaluation (Signed)
Clinical/Bedside Swallow Evaluation Patient Details  Name: Tanya Montgomery MRN: [id number] Date of Birth: [birth year]  Today's Date: 01/12/2015 Time: SLP Start Time (ACUTE ONLY): 87 SLP Stop Time (ACUTE ONLY): 1330 SLP Time Calculation (min) (ACUTE ONLY): 60 min  Past Medical History:  Past Medical History  Diagnosis Date  . HTN (hypertension)   . Heart attack   . Diabetes insipidus   . High cholesterol   . Kidney disease   . Sleep apnea   . Breast cancer 2000   . Heart murmur   . COPD (chronic obstructive pulmonary disease)   . GERD (gastroesophageal reflux disease)   . Thyroid nodule 2014  . Stroke     TIA  . Diabetes mellitus without complication   . CHF (congestive heart failure)   . Asthma    Past Surgical History:  Past Surgical History  Procedure Laterality Date  . Total abdominal hysterectomy    . Mastectomy  2001  . Appendectomy    . Carotid stent    . Tonsillectomy and adenoidectomy    . Coronary angioplasty  1998  . Parathyroidectomy Right 2002    Right inferior gland  . Breast surgery Left 2001  . Cholecystectomy      2004  . Nissen fundoplication N/A 3220   HPI:  Pt is a 74 y.o. female with a known history of CVA 8.19.16, hypertension, diabetes, high cholesterol, chronic kidney disease, breast cancer, COPD, TIA, CHF. She lives at home and daughter and family currently since recent discharge. She was able to take care of her day-to-day requirements prior to her previous admission on 8.19.16, but since that admission and dx of CVA then, she has been more dependent on Dtr and family for all ADLs. Dtr has noticed intermittent coughing w/ food and liquids at home as well as oral phase loss of bolus when eating/masticating. Dtr stated pt's previous deficits have improved "some" until this admission. Pt was eating a regular diet at home but is now NPO since this admission d/t concern for new CVA and dysphagia.   Assessment / Plan / Recommendation Clinical  Impression  Pt presents w/ increased risk for aspiration w/ po trials given despite aspiration precautions. Pt exhibited immediate coughing w/ trials of thin liquids and a solid - suspect discoordination during the swallowing. Pt does have a recent h/o CVA w/ reported dysphagia at home since then per Dtr. Pt requires verbal cues to attend to tasks and follow through w/ tasks. Rec. pt remain NPO w/ f/u w/ MBSS tomorrow. MD consulted and agreed. NSG and Dtr updated. Rec. oral care w/ single chips post oral care PRN for pleasure.     Aspiration Risk  Moderate    Diet Recommendation NPO (w/ ice chips post oral care; aspiration precautions)   Medication Administration:  (TBD) Compensations:  (single ice chips)    Other  Recommendations Recommended Consults:  (TBD) Oral Care Recommendations: Oral care BID;Staff/trained caregiver to provide oral care Other Recommendations:  (TBD)   Follow Up Recommendations       Frequency and Duration min 3x week  1 week   Pertinent Vitals/Pain denied    SLP Swallow Goals  see care plan   Swallow Study Prior Functional Status  Type of Home: House Available Help at Discharge: Family  Dysphagia w/ regular diet and thin liquids at home per Dtr's report    General Date of Onset: 01/11/15 Other Pertinent Information: Pt is a 74 y.o. female with a known history of CVA  8.19.16, hypertension, diabetes, high cholesterol, chronic kidney disease, breast cancer, COPD, TIA, CHF. She lives at home and daughter and family currently since recent discharge. She was able to take care of her day-to-day requirements prior to her previous admission on 8.19.16, but since that admission and dx of CVA then, she has been more dependent on Dtr and family for all ADLs. Dtr has noticed intermittent coughing w/ food and liquids at home as well as oral phase loss of bolus when eating/masticating. Dtr stated pt's previous deficits have improved "some" until this admission. Pt was  eating a regular diet at home but is now NPO since this admission d/t concern for new CVA and dysphagia. Type of Study: Bedside swallow evaluation Previous Swallow Assessment: 8.20.16 Diet Prior to this Study: Regular;Thin liquids (no straws; aspiration precautions) Temperature Spikes Noted: No (wbc wnl) Respiratory Status: Room air History of Recent Intubation: No Behavior/Cognition: Alert;Cooperative;Pleasant mood;Distractible;Requires cueing (suspect d/t R CVA) Oral Cavity - Dentition: Adequate natural dentition/normal for age;Missing dentition ((molars)) Self-Feeding Abilities: Able to feed self;Needs assist;Needs set up (R UE shaky) Patient Positioning: Upright in bed Baseline Vocal Quality: Normal Volitional Cough: Strong Volitional Swallow: Able to elicit    Oral/Motor/Sensory Function Overall Oral Motor/Sensory Function: Impaired at baseline Labial ROM: Reduced left Labial Symmetry: Abnormal symmetry left Labial Strength: Reduced (Left ) Labial Sensation: Reduced (Left) Lingual ROM: Reduced left (slight deviation to Left) Lingual Symmetry: Abnormal symmetry left Lingual Strength: Reduced (Left) Facial Symmetry:  (slight on Left) Mandible: Within Functional Limits   Ice Chips Ice chips: Within functional limits Presentation: Spoon;Self Fed (fed; 6 trials)   Thin Liquid Thin Liquid: Impaired Presentation: Cup;Self Fed;Straw (2 trials via cup; 4 trials via straw) Oral Phase Impairments: Reduced labial seal Oral Phase Functional Implications: Left anterior spillage (min. ) Pharyngeal  Phase Impairments: Cough - Immediate;Cough - Delayed (x2 trials)    Nectar Thick Nectar Thick Liquid: Not tested   Honey Thick Honey Thick Liquid: Not tested   Puree Puree: Within functional limits Presentation: Self Fed;Spoon (fed; 6-7 trials total)   Solid   GO    Solid: Impaired Presentation:  (fed; 1 trial) Oral Phase Impairments: Reduced lingual movement/coordination Oral Phase  Functional Implications:  (none) Pharyngeal Phase Impairments: Cough - Immediate      Orinda Kenner, MS, CCC-SLP  Watson,Katherine 01/12/2015,2:09 PM

## 2015-01-12 NOTE — Progress Notes (Signed)
   01/12/15 1235  Clinical Encounter Type  Visited With Patient and family together  Visit Type Initial  Referral From Nurse  Consult/Referral To Chaplain  Spiritual Encounters  Spiritual Needs Literature  Advance Directives (For Healthcare)  Does patient have an advance directive? No  Would patient like information on creating an advanced directive? Yes - Scientist, clinical (histocompatibility and immunogenetics) given  Provided HCPOA/Living Will education and forms.  May complete tomorrow morning.  Will follow up in the morning after patient and family has a chance to read the materials.  [place] [phone]

## 2015-01-12 NOTE — Plan of Care (Signed)
Problem: Discharge/Transitional Outcomes Goal: Other Discharge Outcomes/Goals Outcome: Progressing Plan of care progress to goal: Patient admitted with CVA. Patient had Cva 3 weeks ago, Patient with left side weakness, MRI brain shows small acute infarct in frontal lobe.patient is alert and oriented.  Mobility: Patient can be up with 1 assist to bsc, incontinent at times Diet: Patient can swallow her medications whole in applesauce with sips of water. Pain: Patient does complain of rectal pain from hemorrhoids and abd pain. Patient had suppository for constipation. Patient has had some small loose stools this shift.  Patient with left side weakness, Patient has also had mastectomy left side, no needle sticks or b/p in left arm.

## 2015-01-13 ENCOUNTER — Inpatient Hospital Stay: Payer: Medicare Other

## 2015-01-13 LAB — GLUCOSE, CAPILLARY
GLUCOSE-CAPILLARY: 224 mg/dL — AB (ref 65–99)
GLUCOSE-CAPILLARY: 289 mg/dL — AB (ref 65–99)
Glucose-Capillary: 249 mg/dL — ABNORMAL HIGH (ref 65–99)
Glucose-Capillary: 216 mg/dL — ABNORMAL HIGH (ref 65–99)

## 2015-01-13 LAB — C DIFFICILE QUICK SCREEN W PCR REFLEX
C Diff antigen: POSITIVE — AB
C Diff toxin: NEGATIVE

## 2015-01-13 MED ORDER — VANCOMYCIN 50 MG/ML ORAL SOLUTION
125.0000 mg | Freq: Four times a day (QID) | ORAL | Status: DC
  Administered 2015-01-13 – 2015-01-14 (×6): 125 mg via ORAL
  Filled 2015-01-13 (×8): qty 2.5

## 2015-01-13 MED ORDER — PANTOPRAZOLE SODIUM 40 MG PO TBEC
40.0000 mg | DELAYED_RELEASE_TABLET | Freq: Two times a day (BID) | ORAL | Status: DC
  Administered 2015-01-13 – 2015-01-14 (×2): 40 mg via ORAL
  Filled 2015-01-13 (×2): qty 1

## 2015-01-13 MED ORDER — INSULIN GLARGINE 100 UNIT/ML ~~LOC~~ SOLN
13.0000 [IU] | Freq: Every day | SUBCUTANEOUS | Status: DC
  Administered 2015-01-13: 21:00:00 13 [IU] via SUBCUTANEOUS
  Filled 2015-01-13 (×2): qty 0.13

## 2015-01-13 MED ORDER — INSULIN ASPART 100 UNIT/ML ~~LOC~~ SOLN
0.0000 [IU] | SUBCUTANEOUS | Status: DC
  Administered 2015-01-13: 5 [IU] via SUBCUTANEOUS
  Administered 2015-01-13: 8 [IU] via SUBCUTANEOUS
  Administered 2015-01-14 (×3): 3 [IU] via SUBCUTANEOUS
  Filled 2015-01-13 (×3): qty 3
  Filled 2015-01-13: qty 5
  Filled 2015-01-13: qty 8

## 2015-01-13 NOTE — Progress Notes (Signed)
   01/13/15 0950  Clinical Encounter Type  Visited With Patient not available  Visit Type Follow-up  Consult/Referral To Chaplain  Attempted to visit with patient and find out if she wished to complete Advance Directive forms at this time.  However, pt was sleeping and unable to respond.  Will make on call chaplain aware in case pt wishes to complete HCPOA/Living Will.  [place] [phone]

## 2015-01-13 NOTE — Consult Note (Addendum)
Palliative Medicine Inpatient Consult Note   Name: Tanya Montgomery Date: 01/13/2015 MRN: [id number]  DOB: [birth year]  Referring Physician: Max Sane, MD  Palliative Care consult requested for this 74 y.o. female for goals of medical therapy in patient with an acute CVA and other problems as detailed under 'IMPRESSION' below.   TODAY'S CONVERSATIONS, EVENTS, AND PLANS:  1.  She continues with DNR status.   2.  She is to go to [facility] today for rehab.  She should have a Palliative Care Consult at that facility.  3.  I have described some of the purposes and usefulness of having a Palliative Care consult following her progress and condition from time to time in the facility.    4.   She may need long term placement after her rehab days are completed.  That has yet to be determined.  (She appears to have Managed Medicare via [employer], [place] PPO, and Tricare for  [place] --but no Medicaid.) Long term arrangements will have to be worked out once it is known what her functional status will be after she gets aggressive therapies.      REVIEW OF SYSTEMS:  Patient is not able to provide ROS in detail due to recent CVA.  However, she is able to tell me she feels stronger, can feel her left hand more today, can smile bigger today, and feels like strength is coming back in her legs.  She ate all of her soft mech lunch (except for the crust of the sandwich). She is alert and grossly oriented. She answers questions.    SPIRITUAL SUPPORT SYSTEM: Yes.  SOCIAL HISTORY:  reports that she has never smoked. She has never used smokeless tobacco. She reports that she does not drink alcohol or use illicit drugs.  LEGAL DOCUMENTS:  Armandina Gemma DNR form  CODE STATUS: DNR  PAST MEDICAL HISTORY: Past Medical History  Diagnosis Date  . HTN (hypertension)   . Heart attack   . Diabetes insipidus   . High cholesterol   . Kidney disease   . Sleep apnea   . Breast  cancer 2000   . Heart murmur   . COPD (chronic obstructive pulmonary disease)   . GERD (gastroesophageal reflux disease)   . Thyroid nodule 2014  . Stroke     TIA  . Diabetes mellitus without complication   . CHF (congestive heart failure)   . Asthma     PAST SURGICAL HISTORY:  Past Surgical History  Procedure Laterality Date  . Total abdominal hysterectomy    . Mastectomy  2001  . Appendectomy    . Carotid stent    . Tonsillectomy and adenoidectomy    . Coronary angioplasty  1998  . Parathyroidectomy Right 2002    Right inferior gland  . Breast surgery Left 2001  . Cholecystectomy      2004  . Nissen fundoplication N/A 6789    ALLERGIES:  is allergic to levaquin.  MEDICATIONS:  Current Facility-Administered Medications  Medication Dose Route Frequency Provider Last Rate Last Dose  . apixaban (ELIQUIS) tablet 5 mg  5 mg Oral BID Nicholes Mango, MD   5 mg at 01/13/15 0957  . aspirin chewable tablet 81 mg  81 mg Oral Daily Max Sane, MD   81 mg at 01/13/15 0959  . atenolol (TENORMIN) tablet 25 mg  25 mg Oral BID Nicholes Mango, MD   25 mg at 01/13/15 0958  . atorvastatin (LIPITOR) tablet 40 mg  40 mg  Oral q1800 Nicholes Mango, MD   40 mg at 01/12/15 1822  . bisacodyl (DULCOLAX) suppository 10 mg  10 mg Rectal Daily PRN Nicholes Mango, MD   10 mg at 01/11/15 2011  . buPROPion (WELLBUTRIN SR) 12 hr tablet 150 mg  150 mg Oral BID Nicholes Mango, MD   150 mg at 01/13/15 0958  . digoxin (LANOXIN) tablet 0.125 mg  0.125 mg Oral Q M,W,F Vipul Shah, MD   0.125 mg at 01/12/15 0933  . digoxin (LANOXIN) tablet 0.25 mg  0.25 mg Oral Q T,Th,S,Su Aruna Gouru, MD   0.25 mg at 01/13/15 0959  . hydrocortisone (ANUSOL-HC) suppository 25 mg  25 mg Rectal BID PRN Lance Coon, MD   25 mg at 01/12/15 0145  . insulin aspart (novoLOG) injection 0-5 Units  0-5 Units Subcutaneous QHS Nicholes Mango, MD   5 Units at 01/12/15 2202  . insulin aspart (novoLOG) injection 0-9 Units  0-9 Units Subcutaneous TID WC Nicholes Mango, MD   2 Units at 01/13/15 1000  . insulin aspart (novoLOG) injection 10 Units  10 Units Subcutaneous TID AC Nicholes Mango, MD   10 Units at 01/12/15 1250  . isosorbide dinitrate (ISORDIL) tablet 45 mg  45 mg Oral BID Nicholes Mango, MD   45 mg at 01/13/15 0959  . omega-3 acid ethyl esters (LOVAZA) capsule 2 g  2 g Oral BID Nicholes Mango, MD   2 g at 01/13/15 0958  . sodium chloride 0.9 % injection 3 mL  3 mL Intravenous Q12H Nicholes Mango, MD   3 mL at 01/12/15 2204  . traMADol (ULTRAM) tablet 50 mg  50 mg Oral Daily PRN Nicholes Mango, MD   50 mg at 01/12/15 1835  . valACYclovir (VALTREX) tablet 1,000 mg  1,000 mg Oral Daily Nicholes Mango, MD   1,000 mg at 01/13/15 0958  . vancomycin (VANCOCIN) 50 mg/mL oral solution 125 mg  125 mg Oral QID Max Sane, MD   125 mg at 01/13/15 1000    Vital Signs: BP 157/69 mmHg  Pulse 74  Temp(Src) 98.4 F (36.9 C) (Oral)  Resp 18  Ht 5\' 2"  (1.575 m)  Wt 67.586 kg (149 lb)  BMI 27.25 kg/m2  SpO2 94% Filed Weights   01/11/15 0908 01/11/15 1722  Weight: 66.679 kg (147 lb) 67.586 kg (149 lb)    Estimated body mass index is 27.25 kg/(m^2) as calculated from the following:   Height as of this encounter: 5\' 2"  (1.575 m).   Weight as of this encounter: 67.586 kg (149 lb).  PERFORMANCE STATUS (ECOG) : 3 - Symptomatic, >50% confined to bed  PHYSICAL EXAM: Sitting up in bed, finishing up her soft mech lunch Alert and grossly oriented Smile is less assymetric per son-in-law He says she is the most alert that he has seen her in '3 weeks' EOMI OP clear Hrt rrr no mgr Lungs cta Abd soft and NT Skin warm and dry She has a 3-4 / 5 grip in left hand and has 5/5 downward planter strength in feet Speech is intelligible and speech content is appropriate  LABS: CBC:    Component Value Date/Time   WBC 6.3 01/11/2015 0919   WBC 4.8 12/04/2012 1118   HGB 13.3 01/11/2015 0919   HGB 10.8* 12/04/2012 1118   HCT 39.5 01/11/2015 0919   HCT 31.4* 12/04/2012 1118    PLT 123* 01/11/2015 0919   PLT 302 12/04/2012 1118   MCV 87.1 01/11/2015 0919   MCV 96 12/04/2012  1118   NEUTROABS 5.0 01/11/2015 0919   NEUTROABS 3.0 12/04/2012 1118   LYMPHSABS 0.7* 01/11/2015 0919   LYMPHSABS 1.4 12/04/2012 1118   MONOABS 0.5 01/11/2015 0919   MONOABS 0.4 12/04/2012 1118   EOSABS 0.0 01/11/2015 0919   EOSABS 0.0 12/04/2012 1118   BASOSABS 0.0 01/11/2015 0919   BASOSABS 0.0 12/04/2012 1118   BASOSABS 2 01/19/2012 1415   Comprehensive Metabolic Panel:    Component Value Date/Time   NA 137 01/11/2015 0919   NA 139 12/04/2012 1118   K 3.6 01/11/2015 0919   K 4.6 12/04/2012 1118   CL 100* 01/11/2015 0919   CL 102 12/04/2012 1118   CO2 28 01/11/2015 0919   CO2 28 12/04/2012 1118   BUN 11 01/11/2015 0919   BUN 16 12/04/2012 1118   CREATININE 0.77 01/11/2015 0919   CREATININE 1.04 12/04/2012 1118   GLUCOSE 242* 01/11/2015 0919   GLUCOSE 193* 12/04/2012 1118   CALCIUM 8.9 01/11/2015 0919   CALCIUM 9.2 12/04/2012 1118   AST 32 01/11/2015 0919   AST 25 12/04/2012 1118   ALT 32 01/11/2015 0919   ALT 53 12/04/2012 1118   ALKPHOS 69 01/11/2015 0919   ALKPHOS 97 12/04/2012 1118   BILITOT 1.0 01/11/2015 0919   BILITOT 0.4 12/04/2012 1118   PROT 6.5 01/11/2015 0919   PROT 6.7 12/04/2012 1118   ALBUMIN 3.5 01/11/2015 0919   ALBUMIN 3.3* 12/04/2012 1118    IMPRESSION: 1. Acute CVA ---R frontal lobe infarct ---getting Eliquis  ---to go for rehab 2.  Gait disorder and frequent falls ---Rehab and then see if there is improvement 3.  C Diff colitis --treatment underway 4.  COPD w/o exacerbation 5. Essential HTN 6.  Hematuria --resolved   REFERRALS TO BE ORDERED:  Palliative Consult in the Facility (Pt and Family agree).    More than 50% of the visit was spent in counseling/coordination of care: Yes  Time Spent: 50 minutes

## 2015-01-13 NOTE — Plan of Care (Signed)
Problem: Spiritual Needs Goal: Ability to function at adequate level Outcome: Adequate for Discharge 1.  Patient and family assisted by care management and social work to identify resources for care. 2.  Patient and family verbalized feelings, preferences and concerns regarding continuation of care after hospitalization. 3.  Quiet environment provided throughout shift.  Patient expressed that her primary goal was rest this shift. 4.  Nurse available and sensitive to patient's needs.  Patient asked what her primary goal for shift was and nurse facilitated this request. 5.  No referral to spiritual care as not requested and declined by patient. 6.  Case Management/Social Work involved in discharge needs and SNF placement.  Problem: Discharge/Transitional Outcomes Goal: Barriers To Progression Addressed/Resolved Outcome: Adequate for Discharge Discharge/Transitional Outcomes: 1.  No barriers to progression at this time. 2.  Education plan given to patient and family by physician, nurses and social work. 3.  Patient mobility to be assessed by PT today. 4.  Patient tolerating diet. 5.  INR monitor plan established and administered. 6.  Family and patient have been involved in decisions regarding discharge and have communicated their wishes with social work. 7.  Family/caregiver in full support of discharge plan.

## 2015-01-13 NOTE — Progress Notes (Signed)
Advance directives in file

## 2015-01-13 NOTE — Progress Notes (Signed)
Inpatient Diabetes Program Recommendations  AACE/ADA: New Consensus Statement on Inpatient Glycemic Control (2013)  Target Ranges:  Prepandial:   less than 140 mg/dL      Peak postprandial:   less than 180 mg/dL (1-2 hours)      Critically ill patients:  140 - 180 mg/dL   Results for SORCHA, ROTUNNO (MRN [id number]) as of 01/13/2015 09:41  Ref. Range 01/12/2015 07:12 01/12/2015 11:58 01/12/2015 16:30 01/12/2015 21:20 01/13/2015 07:32  Glucose-Capillary Latest Ref Range: 65-99 mg/dL 245 (H) 218 (H) 98 221 (H) 224 (H)    Diabetes history: DM2 Outpatient Diabetes medications: Glipizide 10 mg daily, Novolog 10 units TID with meals, Lantus 50 units QHS Current orders for Inpatient glycemic control: Novolog 0-9 units TID with meals, Novolog 0-5 units HS, Novolog 10 units TID with meals  Inpatient Diabetes Program Recommendations Insulin - Basal: Please consider ordering Lantus 13 units daily (based on 67 kg x 0.2 units). Correction (SSI): Please consider increasing Novolog correction to moderate correction scale and change frequency to Q4H since patient is NPO. Insulin - Meal Coverage: Patient has Novolog 10 units TID with meals ordered for meal coverage but patient is NPO at this time and received meal coverage twice yesterday despite being NPO. Please discontinue Novolog 10 units TID with meals until diet is resumed and patient is eating at least 50% of meals.  Thanks, Barnie Alderman, RN, MSN, CCRN, CDE Diabetes Coordinator Inpatient Diabetes Program [phone] (Team Pager from [place] to [place]) [phone] ([facility] office) [phone] [facility] office) [phone] [facility] office)

## 2015-01-13 NOTE — Care Management Important Message (Signed)
Important Message  Patient Details  Name: Tanya Montgomery MRN: [id number] Date of Birth: [birth year]   Medicare Important Message Given:  Yes-second notification given    Shelbie Ammons, RN 01/13/2015, 1:03 PM

## 2015-01-13 NOTE — Progress Notes (Signed)
[place] [place] NAME: Tanya Montgomery    MR#:  [id number]  DATE OF BIRTH:  1941-03-06  SUBJECTIVE:  CHIEF COMPLAINT:   Chief Complaint  Patient presents with  . Fall  . Altered Mental Status  having lots of diarrhea. C.diff +, NPO for MBS REVIEW OF SYSTEMS:  Review of Systems  Constitutional: Negative for fever, weight loss, malaise/fatigue and diaphoresis.  HENT: Negative for ear discharge, ear pain, hearing loss, nosebleeds, sore throat and tinnitus.   Eyes: Negative for blurred vision and pain.  Respiratory: Negative for cough, hemoptysis, shortness of breath and wheezing.   Cardiovascular: Negative for chest pain, palpitations, orthopnea and leg swelling.  Gastrointestinal: Positive for diarrhea. Negative for heartburn, nausea, vomiting, abdominal pain, constipation and blood in stool.  Genitourinary: Negative for dysuria, urgency and frequency.  Musculoskeletal: Negative for myalgias and back pain.  Skin: Negative for itching and rash.  Neurological: Positive for sensory change and focal weakness. Negative for dizziness, tingling, tremors, seizures, weakness and headaches.  Psychiatric/Behavioral: Negative for depression. The patient is not nervous/anxious.    DRUG ALLERGIES:   Allergies  Allergen Reactions  . Levaquin [Levofloxacin In D5w] Other (See Comments)    Reaction:  Tachycardia   VITALS:  Blood pressure 157/72, pulse 77, temperature 99.2 F (37.3 C), temperature source Oral, resp. rate 18, height 5\' 2"  (1.575 m), weight 67.586 kg (149 lb), SpO2 94 %. PHYSICAL EXAMINATION:  Physical Exam  Constitutional: She is oriented to person, place, and time and well-developed, well-nourished, and in no distress.  HENT:  Head: Normocephalic and atraumatic.  Eyes: Conjunctivae and EOM are normal. Pupils are equal, round, and reactive to light.  Neck: Normal range of motion. Neck supple. No tracheal deviation  present. No thyromegaly present.  Cardiovascular: Normal rate, regular rhythm and normal heart sounds.   Pulmonary/Chest: Effort normal and breath sounds normal. No respiratory distress. She has no wheezes. She exhibits no tenderness.  Abdominal: Soft. Bowel sounds are normal. She exhibits no distension. There is no tenderness.  Musculoskeletal: Normal range of motion.  Neurological: She is alert and oriented to person, place, and time. She displays weakness (lt > rt) and facial asymmetry. A sensory deficit is present. No cranial nerve deficit.  Skin: Skin is warm and dry. No rash noted.  Psychiatric: Mood and affect normal.   LABORATORY PANEL:   CBC  Recent Labs Lab 01/11/15 0919  WBC 6.3  HGB 13.3  HCT 39.5  PLT 123*   ------------------------------------------------------------------------------------------------------------------ Chemistries   Recent Labs Lab 01/11/15 0919  NA 137  K 3.6  CL 100*  CO2 28  GLUCOSE 242*  BUN 11  CREATININE 0.77  CALCIUM 8.9  AST 32  ALT 32  ALKPHOS 69  BILITOT 1.0   RADIOLOGY:  No results found. ASSESSMENT AND PLAN:   1. Acute CVA: MRI shows new R frontal lobe infarct - continue Eliquis 5 mg by mouth twice a day per Neuro - will need rehab  2. Frequent falls with intermittent episodes of altered mental status - SNF placement   3. C.Diff colitis: causing Diarrhea, will start PO Vanco if she is able to take PO  4. Chronic COPD-no exacerbation at this time Provide inhalers as needed basis as an  5. Essetial HTN The patient is denying any chest pain or headache   6. Hematuria: resolved  Had long d/w daughter about her living will and code status - she feels she is unable to  manage her at home with so many falls recently and she is not able to sleep. With her and her husband's job it's hard to manage everything. She is wanting to explore possible Hospice option if patinet qualifies. Will REQUEST palliative care c/s - they  will see today  Will need placement - SNF/STR - may be with option of Hospice if she fails  All the records are reviewed and case discussed with Care Management/Social Worker. Management plans discussed with the patient, family and they are in agreement.  CODE STATUS: DNR  TOTAL TIME TAKING CARE OF THIS PATIENT: 35 minutes.   More than 50% of the time was spent in counseling/coordination of care: YES  POSSIBLE D/C IN AM, DEPENDING ON CLINICAL CONDITION.   [facility], Derisha Funderburke M.D on 01/13/2015 at 7:57 AM  Between 7am to 6pm - Pager - [phone]  After 6pm go to [email or web address] - password [email or web address] [facility]  Office  [phone]  CC:  Primary care physician; PROVIDER NOT IN SYSTEM

## 2015-01-13 NOTE — Progress Notes (Signed)
Physical Therapy Treatment Patient Details Name: Tanya Montgomery MRN: [id number] DOB: February 20, 1941 Today's Date: 01/13/2015    History of Present Illness Pt is a 74 y.o. female with recent admission for CVA about 3 weeks ago and re-admitted for new CVA.  CT head:  expected evolution of R parietal infarct with cortical laminar necrosis.  MRI head:  small acute to early subacute infarct in posterior R frontal lobe.  PMH includes:  chronic L shoulder problems, breast CA, COPD, htn, MI, stroke.    PT Comments    Pt able to progress to ambulating 15 feet x4 trials with hand hold assist but required min assist to steady and sitting rest breaks d/t fatigue.  Pt appears very motivated to participate in PT.  Will continue to progress pt with focus on balance, strengthening, and progressive gait training per pt tolerance.  Follow Up Recommendations  SNF     Equipment Recommendations       Recommendations for Other Services       Precautions / Restrictions Precautions Precautions: Fall Restrictions Weight Bearing Restrictions: No    Mobility  Bed Mobility Overal bed mobility: Needs Assistance Bed Mobility: Supine to Sit;Sit to Supine     Supine to sit: Supervision;HOB elevated Sit to supine: Supervision;HOB elevated      Transfers Overall transfer level: Needs assistance Equipment used: 1 person hand held assist Transfers: Sit to/from Stand Sit to Stand: Min assist         General transfer comment: x5 trials  Ambulation/Gait Ambulation/Gait assistance: Min assist Ambulation Distance (Feet):  (15 feet x4) Assistive device: 1 person hand held assist       General Gait Details: decreased B step length/foot clearance/heelstrike; unsteady   Stairs            Wheelchair Mobility    Modified Rankin (Stroke Patients Only)       Balance Overall balance assessment: Needs assistance;History of Falls Sitting-balance support: No upper extremity supported;Feet  supported Sitting balance-Leahy Scale: Good     Standing balance support: Single extremity supported Standing balance-Leahy Scale: Fair                      Cognition Arousal/Alertness: Awake/alert Behavior During Therapy: WFL for tasks assessed/performed Overall Cognitive Status: Within Functional Limits for tasks assessed                      Exercises      General Comments   Nursing cleared pt for participation in physical therapy.  Pt agreeable to PT session. Pt's daughter present end of session.      Pertinent Vitals/Pain Pain Assessment: No/denies pain  Vitals stable and WFL throughout treatment session.    Home Living                      Prior Function            PT Goals (current goals can now be found in the care plan section) Acute Rehab PT Goals Patient Stated Goal: to be able to transfer to toilet herself PT Goal Formulation: With patient/family Time For Goal Achievement: 01/26/15 Potential to Achieve Goals: Good Progress towards PT goals: Progressing toward goals    Frequency  7X/week    PT Plan Current plan remains appropriate    Co-evaluation             End of Session Equipment Utilized During Treatment: Gait belt Activity Tolerance: Patient  limited by fatigue Patient left: in bed;with call bell/phone within reach;with bed alarm set;with family/visitor present     Time: 2979-8921 PT Time Calculation (min) (ACUTE ONLY): 25 min  Charges:  $Gait Training: 23-37 mins                    G CodesLeitha Bleak 01-24-15, 3:42 PM Leitha Bleak, [place]

## 2015-01-13 NOTE — Progress Notes (Signed)
[place] authorization has been received. Auth # [id number]. Plan is for patient to D/C to [facility] tomorrow. Clinical Social Worker (CSW) will continue to follow and assist as needed.   Blima Rich, [place] [phone]

## 2015-01-13 NOTE — Progress Notes (Signed)
Clinical Education officer, museum (CSW) presented bed offers to patient and her daughter at bedside. They chose [facility]. CSW contacted [facility] admissions coordinator at [place] and made him aware of accepted bed offer. CSW contacted Verdell Face Case Manager and made her aware of above. CSW faxed speech note to [facility] today. Daughter requested a chaplain for completion of advanced directive. RN put in chaplain consult. CSW will continue to follow and assist as needed.   Blima Rich, [place] [phone]

## 2015-01-13 NOTE — Plan of Care (Signed)
Problem: Discharge/Transitional Outcomes Goal: Other Discharge Outcomes/Goals Outcome: Progressing VSS, patient had no c/o pain or nausea  This shift Up to [facility] with a one person assist  Patient had Barium Swallow done today and tolerated procedure well Diet advance to DYs III and patient is tolerating well, Strict Aspiration precautions in place  Family at beside patient is expected to discharge to [facility] tomorrow

## 2015-01-13 NOTE — Evaluation (Addendum)
Objective Swallowing Evaluation: Other (Comment) (MBSS)  Patient Details  Name: Tanya Montgomery MRN: [id number] Date of Birth: 02/26/1941  Today's Date: 01/13/2015 Time: SLP Start Time (ACUTE ONLY): 1000-SLP Stop Time (ACUTE ONLY): 1100 SLP Time Calculation (min) (ACUTE ONLY): 60 min  Past Medical History:  Past Medical History  Diagnosis Date  . HTN (hypertension)   . Heart attack   . Diabetes insipidus   . High cholesterol   . Kidney disease   . Sleep apnea   . Breast cancer 2000   . Heart murmur   . COPD (chronic obstructive pulmonary disease)   . GERD (gastroesophageal reflux disease)   . Thyroid nodule 2014  . Stroke     TIA  . Diabetes mellitus without complication   . CHF (congestive heart failure)   . Asthma    Past Surgical History:  Past Surgical History  Procedure Laterality Date  . Total abdominal hysterectomy    . Mastectomy  2001  . Appendectomy    . Carotid stent    . Tonsillectomy and adenoidectomy    . Coronary angioplasty  1998  . Parathyroidectomy Right 2002    Right inferior gland  . Breast surgery Left 2001  . Cholecystectomy      2004  . Nissen fundoplication N/A 0932   Subjective: Chief complaint: dysphagia   Objective:  Radiological Procedure: A videoflouroscopic evaluation of oral-preparatory, reflex initiation, and pharyngeal phases of the swallow was performed; as well as a screening of the upper esophageal phase.  I. POSTURE: upright II. VIEW: lateral III. COMPENSATORY STRATEGIES: f/u swallow IV. BOLUSES ADMINISTERED:  Thin Liquid: 5 trials  Nectar-thick Liquid: 2 trials  Honey-thick Liquid: NT   Puree: 3 trials  Mechanical Soft: 1 trial V. RESULTS OF EVALUATION: A. ORAL PREPARATORY PHASE: (The lips, tongue, and velum are observed for strength and coordination)       **Overall Severity Rating: WFL. Timely A-P transfer w/ liquids and purees w/ adequate oral clearing b/t trials assessed, however, w/ increased textures,  demo. increased effort w/ mastication and more oral residue on Left side(noted at Bedside w/ increased textures).   B. SWALLOW INITIATION/REFLEX: (The reflex is normal if "triggered" by the time the bolus reached the base of the tongue)  **Overall Severity Rating: MILD-MODERATE. Noted delayed pharyngeal swallow initiation moreso w/ thin liquids resulting in consistent laryngeal penetration w/ thin liquids(Silent). A more timely pharyngeal swallow was noted w/ trials of Nectar liquids and no laryngeal penetration occurred during the study.  C. PHARYNGEAL PHASE: (Pharyngeal function is normal if the bolus shows rapid, smooth, and continuous transit through the pharynx and there is no pharyngeal residue after the swallow)  **Overall Severity Rating: MILD. Noted min. Pharyngeal residue remaining post swallow appearing d/t decreased pharyngeal pressure during the swallow; tongue base contact and pressure. This resulted in min. Pharyngeal residue which cleared w/ a f/u swallow(independent swallow by pt).   D. LARYNGEAL PENETRATION: (Material entering into the laryngeal inlet/vestibule but not aspirated): 5/5 trials  E. ASPIRATION: none but contact to the vocal cords was suspected 1/5 penetration trials F. ESOPHAGEAL PHASE: (Screening of the upper esophagus): none noted in cervical Esophagus  ASSESSMENT: Pt appeared to present w/ mild-moderate pharyngeal swallow initiation c/b delayed pharyngeal swallow initiation w/ thin liquids resulting in consistent laryngeal penetration of thin liquids to the level of the vocal cords at times - this was Silent in nature. A more timely pharyngeal swallow initiation was noted w/ Nectar consistency liquids. Pt exhibited min. amount of  pharyngeal residue post swallow which cleared w/ her own f/u swallowing - no cues were nec. Pt does have oral phase deficits w/ increased texture of solids (meat and bread) as noted at bedside; pt requires lingual sweeping to clear orally. Pt  appears to present w/ increased risk for aspiration w/ thin liquids at this time suspect d/t recent CVA. Dtr has stated pt is frequently coughing w/ liquids. Pt's increased oral phase deficits w/ solids can increase risk for aspiration as well. MD consulted and agreed w/ modifying diet to Nectar consistency liquids; f/u dysphagia tx including further education and strategies; trials to upgrade diet when appropriate.   PLAN/RECOMMENDATIONS:  A. Diet: Dys. III w/ Nectar liquids  B. Swallowing Precautions: aspiration precautions; f/u (dry) swallow w/ each bolus; lingual sweeping to clear orally; alternate food/liquid to aid clearing  C. Recommended consultation to: SNF w/ dysphagia tx  D. Therapy recommendations: dysphagia tx targeting delayed pharyngeal swallow initiation  E. Results and recommendations were discussed w/ MD and pt; NSG.   HPI:  Other Pertinent Information: Pt is a 74 y.o. female with a known history of CVA 8.19.16, hypertension, diabetes, high cholesterol, chronic kidney disease, breast cancer, COPD, TIA, CHF. She lives at home and daughter and family currently since recent discharge. She was able to take care of her day-to-day requirements prior to her previous admission on 8.19.16, but since that admission and dx of CVA then, she has been more dependent on Dtr and family for all ADLs. Dtr has noticed intermittent coughing w/ food and liquids at home as well as oral phase loss of bolus when eating/masticating. Dtr stated pt's previous deficits have improved "some" until this admission. Pt was eating a regular diet at home but is now NPO since this admission d/t concern for new CVA and dysphagia.  No Data Recorded  Assessment / Plan / Recommendation No flowsheet data found.    CHL IP TREATMENT RECOMMENDATION 01/13/2015  Treatment Recommendations Therapy as outlined in treatment plan below     CHL IP DIET RECOMMENDATION 01/13/2015  SLP Diet Recommendations Dysphagia 3 (Mech soft);Nectar   Liquid Administration via (None)  Medication Administration Whole meds with puree  Compensations Minimize environmental distractions;Slow rate;Small sips/bites;Multiple dry swallows after each bite/sip  Postural Changes and/or Swallow Maneuvers (None)     CHL IP OTHER RECOMMENDATIONS 01/13/2015  Recommended Consults (No Data)  Oral Care Recommendations Oral care BID;Staff/trained caregiver to provide oral care  Other Recommendations Order thickener from pharmacy;Prohibited food (jello, ice cream, thin soups);Remove water pitcher     No flowsheet data found.   CHL IP FREQUENCY AND DURATION 01/13/2015  Speech Therapy Frequency (ACUTE ONLY) min 3x week  Treatment Duration 1 week     Pertinent Vitals/Pain denied    SLP Swallow Goals F/u once discharge to SNF for further dysphagia tx      CHL IP REASON FOR REFERRAL 01/13/2015  Reason for Referral Objectively evaluate swallowing function                       Orinda Kenner, MS, CCC-SLP  Watson,Katherine 01/13/2015, 2:01 PM

## 2015-01-14 DIAGNOSIS — Z79899 Other long term (current) drug therapy: Secondary | ICD-10-CM

## 2015-01-14 DIAGNOSIS — Z794 Long term (current) use of insulin: Secondary | ICD-10-CM

## 2015-01-14 DIAGNOSIS — E780 Pure hypercholesterolemia: Secondary | ICD-10-CM

## 2015-01-14 DIAGNOSIS — J449 Chronic obstructive pulmonary disease, unspecified: Secondary | ICD-10-CM

## 2015-01-14 DIAGNOSIS — I10 Essential (primary) hypertension: Secondary | ICD-10-CM

## 2015-01-14 DIAGNOSIS — A047 Enterocolitis due to Clostridium difficile: Secondary | ICD-10-CM

## 2015-01-14 DIAGNOSIS — I509 Heart failure, unspecified: Secondary | ICD-10-CM

## 2015-01-14 DIAGNOSIS — Z515 Encounter for palliative care: Secondary | ICD-10-CM

## 2015-01-14 DIAGNOSIS — E119 Type 2 diabetes mellitus without complications: Secondary | ICD-10-CM

## 2015-01-14 LAB — GLUCOSE, CAPILLARY
GLUCOSE-CAPILLARY: 195 mg/dL — AB (ref 65–99)
Glucose-Capillary: 170 mg/dL — ABNORMAL HIGH (ref 65–99)
Glucose-Capillary: 196 mg/dL — ABNORMAL HIGH (ref 65–99)
Glucose-Capillary: 419 mg/dL — ABNORMAL HIGH (ref 65–99)

## 2015-01-14 LAB — GLUCOSE, RANDOM: GLUCOSE: 471 mg/dL — AB (ref 65–99)

## 2015-01-14 LAB — URINE CULTURE
Culture: 50000
SPECIAL REQUESTS: NORMAL

## 2015-01-14 MED ORDER — VANCOMYCIN 50 MG/ML ORAL SOLUTION: 125.0000 mg | Freq: Four times a day (QID) | ORAL | Status: AC

## 2015-01-14 MED ORDER — INSULIN ASPART 100 UNIT/ML ~~LOC~~ SOLN
15.0000 [IU] | Freq: Once | SUBCUTANEOUS | Status: AC
  Administered 2015-01-14: 14:00:00 15 [IU] via SUBCUTANEOUS
  Filled 2015-01-14: qty 15

## 2015-01-14 NOTE — Progress Notes (Signed)
Inpatient Diabetes Program Recommendations  AACE/ADA: New Consensus Statement on Inpatient Glycemic Control (2013)  Target Ranges:  Prepandial:   less than 140 mg/dL      Peak postprandial:   less than 180 mg/dL (1-2 hours)      Critically ill patients:  140 - 180 mg/dL   Diabetes history: DM2 Outpatient Diabetes medications: Glipizide 10 mg daily, Novolog 10 units TID with meals, Lantus 50 units QHS Current orders for Inpatient glycemic control: Novolog 0-9 units Q4H, Lantus 13 units QHS  Inpatient Diabetes Program Recommendations Insulin - Basal: Patient has received a total of Novolog 9 units for correction since midnight and glucose was 196 mg/dl this morning. Please consider increasing Lantus to 17 units QHS (based on 67 kg x 0.25 units).  Thanks, Barnie Alderman, RN, MSN, CCRN, CDE Diabetes Coordinator Inpatient Diabetes Program [phone] (Team Pager from [place] to [place]) [phone] ([place] office) [phone] [facility] office) [phone] [facility] office)

## 2015-01-14 NOTE — Plan of Care (Signed)
Problem: Discharge/Transitional Outcomes Goal: Other Discharge Outcomes/Goals Outcome: Progressing Plan of care progress to goal: VSS Pt uses [facility] with one person assist Has left sided weakness. Diet - Dys 3, takes meds with thickened water

## 2015-01-14 NOTE — Progress Notes (Signed)
Patient is medically stable for D/C to [facility] today. [place] authorization has been received. Per [facility] admissions coordinator at [facility] patient is going to room E-11. RN will call report and arrange EMS for transport. Clinical Education officer, museum (CSW) prepared D/C packet and sent D/C Summary to [employer] via carefinder. Patient is aware of above. Patient's son in law is at bedside and aware of above. Please reconsult if future social work needs arise. CSW signing off.   Blima Rich, [place] [phone]

## 2015-01-14 NOTE — Clinical Social Work Placement (Signed)
   CLINICAL SOCIAL WORK PLACEMENT  NOTE  Date:  01/14/2015  Patient Details  Name: Tanya Montgomery MRN: [id number] Date of Birth: 08/19/1940  Clinical Social Work is seeking post-discharge placement for this patient at the [place] level of care (*CSW will initial, date and re-position this form in  chart as items are completed):  Yes   Patient/family provided with [name] Work Department's list of facilities offering this level of care within the geographic area requested by the patient (or if unable, by the patient's family).  Yes   Patient/family informed of their freedom to choose among providers that offer the needed level of care, that participate in Medicare, Medicaid or managed care program needed by the patient, have an available bed and are willing to accept the patient.  Yes   Patient/family informed of [name]'s ownership interest in [name] and [name], as well as of the fact that they are under no obligation to receive care at these facilities.  PASRR submitted to EDS on 01/12/15     PASRR number received on 01/12/15     Existing PASRR number confirmed on       FL2 transmitted to all facilities in geographic area requested by pt/family on 01/12/15     FL2 transmitted to all facilities within larger geographic area on       Patient informed that his/her managed care company has contracts with or will negotiate with certain facilities, including the following:        Yes   Patient/family informed of bed offers received.  Patient chooses bed at  [name] )     Physician recommends and patient chooses bed at      Patient to be transferred to  [name] ) on 01/14/15.  Patient to be transferred to facility by  [facility] )     Patient family notified on 01/14/15 of transfer.  Name of family member notified:   (Patient's son in law is at bedside and aware of D/C. )     PHYSICIAN        Additional Comment:    _______________________________________________ Loralyn Freshwater, LCSW 01/14/2015, 1:28 PM

## 2015-01-14 NOTE — Care Management Important Message (Signed)
Important Message  Patient Details  Name: BRIONA KORPELA MRN: [id number] Date of Birth: 1941/03/05   Medicare Important Message Given:  Yes-third notification given    Shelbie Ammons, RN 01/14/2015, 11:12 AM

## 2015-01-14 NOTE — Discharge Instructions (Signed)
Ischemic Stroke °A stroke (cerebrovascular accident) is the sudden death of brain tissue. It is a medical emergency. A stroke can cause permanent loss of brain function. This can cause problems with different parts of your body. A transient ischemic attack (TIA) is different because it does not cause permanent damage. A TIA is a short-lived problem of poor blood flow affecting a part of the brain. A TIA is also a serious problem because having a TIA greatly increases the chances of having a stroke. When symptoms first develop, you cannot know if the problem might be a stroke or a TIA. °CAUSES  °A stroke is caused by a decrease of oxygen supply to an area of your brain. It is usually the result of a small blood clot or collection of cholesterol or fat (plaque) that blocks blood flow in the brain. A stroke can also be caused by blocked or damaged carotid arteries.  °RISK FACTORS °· High blood pressure (hypertension). °· High cholesterol. °· Diabetes mellitus. °· Heart disease. °· The buildup of plaque in the blood vessels (peripheral artery disease or atherosclerosis). °· The buildup of plaque in the blood vessels providing blood and oxygen to the brain (carotid artery stenosis). °· An abnormal heart rhythm (atrial fibrillation). °· Obesity. °· Smoking. °· Taking oral contraceptives (especially in combination with smoking). °· Physical inactivity. °· A diet high in fats, salt (sodium), and calories. °· Alcohol use. °· Use of illegal drugs (especially cocaine and methamphetamine). °· Being African American. °· Being over the age of 55. °· Family history of stroke. °· Previous history of blood clots, stroke, TIA, or heart attack. °· Sickle cell disease. °SYMPTOMS  °These symptoms usually develop suddenly, or may be newly present upon awakening from sleep: °· Sudden weakness or numbness of the face, arm, or leg, especially on one side of the body. °· Sudden trouble walking or difficulty moving arms or legs. °· Sudden  confusion. °· Sudden personality changes. °· Trouble speaking (aphasia) or understanding. °· Difficulty swallowing. °· Sudden trouble seeing in one or both eyes. °· Double vision. °· Dizziness. °· Loss of balance or coordination. °· Sudden severe headache with no known cause. °· Trouble reading or writing. °DIAGNOSIS  °Your health care provider can often determine the presence or absence of a stroke based on your symptoms, history, and physical exam. Computed tomography (CT) of the brain is usually performed to confirm the stroke, determine causes, and determine stroke severity. Other tests may be done to find the cause of the stroke. These tests may include: °· Electrocardiography. °· Continuous heart monitoring. °· Echocardiography. °· Carotid ultrasonography. °· Magnetic resonance imaging (MRI). °· A scan of the brain circulation. °· Blood tests. °PREVENTION  °The risk of a stroke can be decreased by appropriately treating high blood pressure, high cholesterol, diabetes, heart disease, and obesity and by quitting smoking, limiting alcohol, and staying physically active. °TREATMENT  °Time is of the essence. It is important to seek treatment at the first sign of these symptoms because you may receive a medicine to dissolve the clot (thrombolytic) that cannot be given if too much time has passed since your symptoms began. Even if you do not know when your symptoms began, get treatment as soon as possible as there are other treatment options available including oxygen, intravenous (IV) fluids, and medicines to thin the blood (anticoagulants). Treatment of stroke depends on the duration, severity, and cause of your symptoms. Medicines and dietary changes may be used to address diabetes, high blood   pressure, and other risk factors. Physical, speech, and occupational therapists will assess you and work with you to improve any functions impaired by the stroke. Measures will be taken to prevent short-term and long-term  complications, including infection from breathing foreign material into the lungs (aspiration pneumonia), blood clots in the legs, bedsores, and falls. Rarely, surgery may be needed to remove large blood clots or to open up blocked arteries. °HOME CARE INSTRUCTIONS  °· Take medicines only as directed by your health care provider. Follow the directions carefully. Medicines may be used to control risk factors for a stroke. Be sure you understand all your medicine instructions. °· You may be told to take a medicine to thin the blood, such as aspirin or the anticoagulant warfarin. Warfarin needs to be taken exactly as instructed. °¨ Too much and too little warfarin are both dangerous. Too much warfarin increases the risk of bleeding. Too little warfarin continues to allow the risk for blood clots. While taking warfarin, you will need to have regular blood tests to measure your blood clotting time. These blood tests usually include both the PT and INR tests. The PT and INR results allow your health care provider to adjust your dose of warfarin. The dose can change for many reasons. It is critically important that you take warfarin exactly as prescribed, and that you have your PT and INR levels drawn exactly as directed. °¨ Many foods, especially foods high in vitamin K, can interfere with warfarin and affect the PT and INR results. Foods high in vitamin K include spinach, kale, broccoli, cabbage, collard and turnip greens, brussels sprouts, peas, cauliflower, seaweed, and parsley, as well as beef and pork liver, green tea, and soybean oil. You should eat a consistent amount of foods high in vitamin K. Avoid major changes in your diet, or notify your health care provider before changing your diet. Arrange a visit with a dietitian to answer your questions. °¨ Many medicines can interfere with warfarin and affect the PT and INR results. You must tell your health care provider about any and all medicines you take. This  includes all vitamins and supplements. Be especially cautious with aspirin and anti-inflammatory medicines. Do not take or discontinue any prescribed or over-the-counter medicine except on the advice of your health care provider or pharmacist. °¨ Warfarin can have side effects, such as excessive bruising or bleeding. You will need to hold pressure over cuts for longer than usual. Your health care provider or pharmacist will discuss other potential side effects. °¨ Avoid sports or activities that may cause injury or bleeding. °¨ Be mindful when shaving, flossing your teeth, or handling sharp objects. °¨ Alcohol can change the body's ability to handle warfarin. It is best to avoid alcoholic drinks or consume only very small amounts while taking warfarin. Notify your health care provider if you change your alcohol intake. °¨ Notify your dentist or other health care providers before procedures. °· If swallow studies have determined that your swallowing reflex is present, you should eat healthy foods. Including 5 or more servings of fruits and vegetables a day may reduce the risk of stroke. Foods may need to be a certain consistency (soft or pureed), or small bites may need to be taken in order to avoid aspirating or choking. Certain dietary changes may be advised to address high blood pressure, high cholesterol, diabetes, or obesity. °¨ Food choices that are low in sodium, saturated fat, trans fat, and cholesterol are recommended to manage high blood pressure. °¨   Food choies that are high in fiber, and low in saturated fat, trans fat, and cholesterol may control cholesterol levels.  Controlling carbohydrates and sugar intake is recommended to manage diabetes.  Reducing calorie intake and making food choices that are low in sodium, saturated fat, trans fat, and cholesterol are recommended to manage obesity.  Maintain a healthy weight.  Stay physically active. It is recommended that you get at least 30 minutes of  activity on all or most days.  Do not use any tobacco products including cigarettes, chewing tobacco, or electronic cigarettes.  Limit alcohol use even if you are not taking warfarin. Moderate alcohol use is considered to be:  No more than 2 drinks each day for men.  No more than 1 drink each day for nonpregnant women.  Home safety. A safe home environment is important to reduce the risk of falls. Your health care provider may arrange for specialists to evaluate your home. Having grab bars in the bedroom and bathroom is often important. Your health care provider may arrange for equipment to be used at home, such as raised toilets and a seat for the shower.  Physical, occupational, and speech therapy. Ongoing therapy may be needed to maximize your recovery after a stroke. If you have been advised to use a walker or a cane, use it at all times. Be sure to keep your therapy appointments.  Follow all instructions for follow-up with your health care provider. This is very important. This includes any referrals, physical therapy, rehabilitation, and lab tests. Proper follow-up can prevent another stroke from occurring. SEEK MEDICAL CARE IF:  You have personality changes.  You have difficulty swallowing.  You are seeing double.  You have dizziness.  You have a fever.  You have skin breakdown. SEEK IMMEDIATE MEDICAL CARE IF:  Any of these symptoms may represent a serious problem that is an emergency. Do not wait to see if the symptoms will go away. Get medical help right away. Call your local emergency services (911 in [place] Do not drive yourself to the hospital.  You have sudden weakness or numbness of the face, arm, or leg, especially on one side of the body.  You have sudden trouble walking or difficulty moving arms or legs.  You have sudden confusion.  You have trouble speaking (aphasia) or understanding.  You have sudden trouble seeing in one or both eyes.  You have a loss of  balance or coordination.  You have a sudden, severe headache with no known cause.  You have new chest pain or an irregular heartbeat.  You have a partial or total loss of consciousness. Document Released: 04/23/2005 Document Revised: 09/07/2013 Document Reviewed: 12/02/2011 [facility] Patient Information 2015 [place], [place]. This information is not intended to replace advice given to you by your health care provider. Make sure you discuss any questions you have with your health care provider.  Clostridium Difficile Infection Clostridium difficile (C. difficile) is a germ found in the intestines. C. difficile infection can occur after taking some medicines. C. difficile infection can cause watery poop (diarrhea) or severe disease. HOME CARE  Drink enough fluids to keep your pee (urine) clear or pale yellow. Avoid milk, caffeine, and alcohol.  Ask your doctor how to replace body fluid losses (rehydrate).  Eat small meals more often rather than large meals.  Take your medicine (antibiotics) as told. Finish it even if you start to feel better.  Do not  use medicines to slow the watery poop.  Wash  your hands well after using the bathroom and before preparing food.  Make sure people who live with you wash their hands often.  Clean all surfaces. Use a product that contains chlorine bleach. GET HELP RIGHT AWAY IF:   The watery poop does not stop, or it comes back after you finish your medicine.  You feel very dry or thirsty (dehydrated).  You have a fever.  You have more belly (abdominal) pain or tenderness.  There is blood in your poop (stool), or your poop is black and tar-like.  You cannot eat food or drink liquids without throwing up (vomiting). MAKE SURE YOU:  Understand these instructions.  Will watch your condition.  Will get help right away if you are not doing well or get worse. Document Released: 02/18/2009 Document Revised: 09/07/2013 Document Reviewed:  09/29/2010 [facility] Patient Information 2015 [place], [place]. This information is not intended to replace advice given to you by your health care provider. Make sure you discuss any questions you have with your health care provider.

## 2015-01-14 NOTE — Discharge Summary (Signed)
[facility] [place] NAME: Tanya Montgomery    MR#:  [id number]  DATE OF BIRTH:  [birth year]  DATE OF ADMISSION:  01/11/2015 ADMITTING PHYSICIAN: Nicholes Mango, MD  DATE OF DISCHARGE: 01/14/2015  PRIMARY CARE PHYSICIAN: Lisette Grinder MD   ADMISSION DIAGNOSIS:  Cerebral infarction due to unspecified mechanism [I63.9] DISCHARGE DIAGNOSIS:  Active Problems:   Acute CVA (cerebrovascular accident)  C. difficile colitis Frequent falls SECONDARY DIAGNOSIS:   Past Medical History  Diagnosis Date  . HTN (hypertension)   . Heart attack   . Diabetes insipidus   . High cholesterol   . Kidney disease   . Sleep apnea   . Breast cancer 2000   . Heart murmur   . COPD (chronic obstructive pulmonary disease)   . GERD (gastroesophageal reflux disease)   . Thyroid nodule 2014  . Stroke     TIA  . Diabetes mellitus without complication   . CHF (congestive heart failure)   . Asthma    HOSPITAL COURSE:  Patient is a 74 year old female with a known history of recent stroke and above-mentioned medical problems was admitted for left-sided numbness, weakness and shakiness with multiple falls, concerning for new stroke.  Please see Dr. Rinaldo Ratel dated history and physical for further details.  Neurology consultation was obtained with Dr. Irish Elders who recommended full neurological evaluation including MRI brain was done. MRI showed new R frontal lobe infarct.  Neurology recommended to continue Eliquis 5 mg by mouth twice a day and rehab.  Patient also had Frequent falls at home, so she was in agreement with skilled nursing facility placement.  While in the hospital,  She reported having loose stool at home for about a week for admission.  Stool was checked and was positive for C.Diff colitis.  She was started on PO Vanco and was slowly improving.  Palliative care consultation was obtained, is recommended to have palliative care evaluation, While at the  facility and if she continues to decline, consider hospice from there. She is agreeable with the discharge plans. DISCHARGE CONDITIONS:  Fair CONSULTS OBTAINED:    Leotis Pain, MD - Neurology DRUG ALLERGIES:   Allergies  Allergen Reactions  . Levaquin [Levofloxacin In D5w] Other (See Comments)    Reaction:  Tachycardia   DISCHARGE MEDICATIONS:   Current Discharge Medication List    START taking these medications   Details  vancomycin (VANCOCIN) 50 mg/mL oral solution Take 2.5 mLs (125 mg total) by mouth 4 (four) times daily. Qty: 500 mL, Refills: 0      CONTINUE these medications which have NOT CHANGED   Details  apixaban (ELIQUIS) 5 MG TABS tablet Take 1 tablet (5 mg total) by mouth 2 (two) times daily. Qty: 60 tablet, Refills: 0    atenolol (TENORMIN) 25 MG tablet Take 25 mg by mouth 2 (two) times daily.    atorvastatin (LIPITOR) 40 MG tablet Take 1 tablet (40 mg total) by mouth daily at 6 PM. Qty: 30 tablet, Refills: 0    BIOTIN PO Take 1 capsule by mouth daily.    buPROPion (WELLBUTRIN SR) 150 MG 12 hr tablet Take 150 mg by mouth 2 (two) times daily.    Calcium Citrate-Vitamin D (CALCIUM + D PO) Take 1 tablet by mouth 2 (two) times daily.    digoxin (LANOXIN) 0.25 MG tablet Take 0.125-0.25 mg by mouth daily. Pt takes 0.125mg  on Monday, Wednesday, and Friday.   Pt takes 0.25mg  on Tuesday, Thursday, Saturday,  and Sunday.    esomeprazole (NEXIUM) 40 MG capsule Take 40 mg by mouth 2 (two) times daily.     furosemide (LASIX) 20 MG tablet Take 20 mg by mouth daily.    glipiZIDE (GLUCOTROL XL) 10 MG 24 hr tablet Take 10 mg by mouth daily.     insulin aspart (NOVOLOG) 100 UNIT/ML injection Inject 10 Units into the skin 3 (three) times daily before meals.    insulin glargine (LANTUS) 100 UNIT/ML injection Inject 50 Units into the skin at bedtime.     isosorbide dinitrate (ISORDIL) 30 MG tablet Take 45 mg by mouth 2 (two) times daily.     Multiple Vitamins-Minerals  (MULTIVITAMIN WITH MINERALS) tablet Take 1 tablet by mouth daily.      naproxen sodium (ANAPROX) 220 MG tablet Take 220 mg by mouth as needed.    potassium chloride (KLOR-CON) 10 MEQ CR tablet Take 10 mEq by mouth daily. Pt only takes four days per week.    valACYclovir (VALTREX) 1000 MG tablet Take 1,000 mg by mouth daily.    valsartan (DIOVAN) 160 MG tablet Take 160 mg by mouth daily.      venlafaxine (EFFEXOR) 75 MG tablet Take 75 mg by mouth 2 (two) times daily.      aspirin EC 81 MG tablet Take 1 tablet (81 mg total) by mouth daily. Qty: 5 tablet, Refills: 0    omega-3 acid ethyl esters (LOVAZA) 1 G capsule Take 2 g by mouth 2 (two) times daily.      STOP taking these medications     traMADol (ULTRAM) 50 MG tablet        DISCHARGE INSTRUCTIONS:   DIET:  Cardiac diet  A. Diet: Dys. III w/ Nectar liquids B. Swallowing Precautions: aspiration precautions; f/u (dry) swallow w/ each bolus; lingual sweeping to clear orally; alternate food/liquid to aid clearing C. Recommended consultation to: SNF w/ dysphagia tx D. Therapy recommendations: dysphagia tx targeting delayed pharyngeal swallow initiation DISCHARGE CONDITION:  Fair ACTIVITY:  Activity as tolerated OXYGEN:  Home Oxygen: No.  Oxygen Delivery: room air DISCHARGE LOCATION:  nursing home   If you experience worsening of your admission symptoms, develop shortness of breath, life threatening emergency, suicidal or homicidal thoughts you must seek medical attention immediately by calling 911 or calling your MD immediately  if symptoms less severe.  You Must read complete instructions/literature along with all the possible adverse reactions/side effects for all the Medicines you take and that have been prescribed to you. Take any new Medicines after you have completely understood and accpet all the possible adverse reactions/side effects.   Please note  You were cared for by a hospitalist during your hospital stay.  If you have any questions about your discharge medications or the care you received while you were in the hospital after you are discharged, you can call the unit and asked to speak with the hospitalist on call if the hospitalist that took care of you is not available. Once you are discharged, your primary care physician will handle any further medical issues. Please note that NO REFILLS for any discharge medications will be authorized once you are discharged, as it is imperative that you return to your primary care physician (or establish a relationship with a primary care physician if you do not have one) for your aftercare needs so that they can reassess your need for medications and monitor your lab values.    On the day of Discharge: VITAL SIGNS:  Blood pressure  166/68, pulse 72, temperature 97.7 F (36.5 C), temperature source Oral, resp. rate 18, height 5\' 2"  (1.575 m), weight 149 lb (67.586 kg), SpO2 96 %. PHYSICAL EXAMINATION:  GENERAL:  74 y.o.-year-old patient lying in the bed with no acute distress.  EYES: Pupils equal, round, reactive to light and accommodation. No scleral icterus. Extraocular muscles intact.  HEENT: Head atraumatic, normocephalic. Oropharynx and nasopharynx clear.  NECK:  Supple, no jugular venous distention. No thyroid enlargement, no tenderness.  LUNGS: Normal breath sounds bilaterally, no wheezing, rales,rhonchi or crepitation. No use of accessory muscles of respiration.  CARDIOVASCULAR: S1, S2 normal. No murmurs, rubs, or gallops.  ABDOMEN: Soft, non-tender, non-distended. Bowel sounds present. No organomegaly or mass.  EXTREMITIES: No pedal edema, cyanosis, or clubbing.  NEUROLOGIC: has some facial droop. Gait not checked. weakness (lt > rt) and facial asymmetry. A sensory deficit is present.  PSYCHIATRIC: The patient is alert and oriented x 3.  SKIN: No obvious rash, lesion, or ulcer.  DATA REVIEW:   CBC  Recent Labs Lab 01/11/15 0919  WBC 6.3  HGB  13.3  HCT 39.5  PLT 123*    Chemistries   Recent Labs Lab 01/11/15 0919  NA 137  K 3.6  CL 100*  CO2 28  GLUCOSE 242*  BUN 11  CREATININE 0.77  CALCIUM 8.9  AST 32  ALT 32  ALKPHOS 69  BILITOT 1.0    Management plans discussed with the patient, family and they are in agreement.  CODE STATUS: DO NOT RESUSCITATE  TOTAL TIME TAKING CARE OF THIS PATIENT: 55 minutes.    [facility], Mirela Parsley M.D on 01/14/2015 at 11:54 AM  Between 7am to 6pm - Pager - [phone]  After 6pm go to [email or web address] - password [email or web address]  Office  [phone]  CC: Primary care physician; Lisette Grinder MD Adrian Prows MD Jennings Books MD Lucilla Lame MD

## 2015-01-14 NOTE — Progress Notes (Signed)
Pt to discharge to  [facility] via ems. Ems called fro transport.  Report called to jennifer at  [facility].sl site d/cd  No voiced c/o. tol diet.

## 2015-03-09 ENCOUNTER — Other Ambulatory Visit: Payer: Self-pay | Admitting: Internal Medicine

## 2015-03-09 DIAGNOSIS — Z1231 Encounter for screening mammogram for malignant neoplasm of breast: Secondary | ICD-10-CM

## 2015-03-22 ENCOUNTER — Other Ambulatory Visit: Payer: Self-pay | Admitting: Internal Medicine

## 2015-03-22 ENCOUNTER — Ambulatory Visit
Admission: RE | Admit: 2015-03-22 | Discharge: 2015-03-22 | Disposition: A | Discharge status: Home / Self Care | Payer: Medicare Other | Source: Ambulatory Visit | Attending: Internal Medicine | Admitting: Internal Medicine

## 2015-03-22 DIAGNOSIS — Z1231 Encounter for screening mammogram for malignant neoplasm of breast: Secondary | ICD-10-CM

## 2015-03-25 ENCOUNTER — Telehealth: Payer: Self-pay | Admitting: General Surgery

## 2015-03-25 NOTE — Telephone Encounter (Signed)
PT'S DAUGHTER(ELLEN NASH)CALLED & WOULD LIKE FOR YOU TO SEND A SCRIPT TO [facility] FOR BRA'S & A NEW PROSTHESIS.LAST SEEN IN 2014.[facility] [phone] [phone]. NOTE PT HAS HAS SEVERAL STROKES & IS NOW LIVING WITH HER DAUGHTER ELLEN. ELLEN STATE'S SHE HAS LOST A LOT OF WEIGHT IS WHY SHE'S NEEDING THE BRA'S.

## 2015-04-04 NOTE — Telephone Encounter (Signed)
04-04-15 @ 1:45PM CALLED [facility] [phone] & L/M TO SEE IF THEY RECEIVED SCRIPT ON THIS PT OR IF STILL NEED ONE FAXED/MTH

## 2015-04-05 NOTE — Telephone Encounter (Signed)
Tanya Montgomery from [facility] called to verify that they did receive the script.

## 2015-04-22 ENCOUNTER — Other Ambulatory Visit
Admission: RE | Admit: 2015-04-22 | Discharge: 2015-04-22 | Disposition: A | Discharge status: Home / Self Care | Payer: Medicare Other | Source: Ambulatory Visit | Attending: Nurse Practitioner | Admitting: Nurse Practitioner

## 2015-04-22 DIAGNOSIS — R197 Diarrhea, unspecified: Secondary | ICD-10-CM | POA: Diagnosis present

## 2015-04-22 LAB — C DIFFICILE QUICK SCREEN W PCR REFLEX
C Diff antigen: NEGATIVE
C Diff interpretation: NEGATIVE
C Diff toxin: NEGATIVE

## 2015-04-26 LAB — MISC LABCORP TEST (SEND OUT): Labcorp test code: 8144

## 2015-04-28 LAB — O&P RESULT

## 2015-04-28 LAB — GIARDIA, EIA; OVA/PARASITE: Giardia Ag, Stl: NEGATIVE

## 2015-05-12 ENCOUNTER — Emergency Department
Admission: EM | Admit: 2015-05-12 | Discharge: 2015-05-12 | Disposition: A | Discharge status: Home / Self Care | Payer: Medicare Other | Attending: Emergency Medicine | Admitting: Emergency Medicine

## 2015-05-12 DIAGNOSIS — I10 Essential (primary) hypertension: Secondary | ICD-10-CM | POA: Insufficient documentation

## 2015-05-12 DIAGNOSIS — E162 Hypoglycemia, unspecified: Secondary | ICD-10-CM

## 2015-05-12 DIAGNOSIS — E11649 Type 2 diabetes mellitus with hypoglycemia without coma: Secondary | ICD-10-CM | POA: Diagnosis not present

## 2015-05-12 LAB — COMPREHENSIVE METABOLIC PANEL
ALT: 18 U/L (ref 14–54)
AST: 31 U/L (ref 15–41)
Albumin: 3.9 g/dL (ref 3.5–5.0)
Alkaline Phosphatase: 51 U/L (ref 38–126)
Anion gap: 10 (ref 5–15)
BILIRUBIN TOTAL: 1.2 mg/dL (ref 0.3–1.2)
BUN: 33 mg/dL — AB (ref 6–20)
CHLORIDE: 100 mmol/L — AB (ref 101–111)
CO2: 27 mmol/L (ref 22–32)
CREATININE: 1.32 mg/dL — AB (ref 0.44–1.00)
Calcium: 8.2 mg/dL — ABNORMAL LOW (ref 8.9–10.3)
GFR, EST AFRICAN AMERICAN: 44 mL/min — AB (ref >60)
GFR, EST NON AFRICAN AMERICAN: 38 mL/min — AB (ref >60)
Glucose, Bld: 154 mg/dL — ABNORMAL HIGH (ref 65–99)
POTASSIUM: 4.1 mmol/L (ref 3.5–5.1)
Sodium: 137 mmol/L (ref 135–145)
TOTAL PROTEIN: 6.5 g/dL (ref 6.5–8.1)

## 2015-05-12 LAB — GLUCOSE, CAPILLARY
GLUCOSE-CAPILLARY: 62 mg/dL — AB (ref 65–99)
GLUCOSE-CAPILLARY: 135 mg/dL — AB (ref 65–99)
Glucose-Capillary: 111 mg/dL — ABNORMAL HIGH (ref 65–99)

## 2015-05-12 LAB — URINALYSIS COMPLETE WITH MICROSCOPIC (ARMC ONLY)
BILIRUBIN URINE: NEGATIVE
Bacteria, UA: NONE SEEN
GLUCOSE, UA: NEGATIVE mg/dL
Hgb urine dipstick: NEGATIVE
KETONES UR: NEGATIVE mg/dL
Leukocytes, UA: NEGATIVE
Nitrite: NEGATIVE
PH: 5 (ref 5.0–8.0)
Protein, ur: NEGATIVE mg/dL
RBC / HPF: NONE SEEN RBC/hpf (ref 0–5)
SQUAMOUS EPITHELIAL / LPF: NONE SEEN
Specific Gravity, Urine: 1.011 (ref 1.005–1.030)

## 2015-05-12 LAB — CBC WITH DIFFERENTIAL/PLATELET
BASOS PCT: 0 %
Basophils Absolute: 0 10*3/uL (ref 0–0.1)
EOS ABS: 0 10*3/uL (ref 0–0.7)
EOS PCT: 0 %
HCT: 37.9 % (ref 35.0–47.0)
HEMOGLOBIN: 12.9 g/dL (ref 12.0–16.0)
LYMPHS ABS: 1.3 10*3/uL (ref 1.0–3.6)
Lymphocytes Relative: 24 %
MCH: 30.7 pg (ref 26.0–34.0)
MCHC: 34 g/dL (ref 32.0–36.0)
MCV: 90.3 fL (ref 80.0–100.0)
MONOS PCT: 8 %
Monocytes Absolute: 0.4 10*3/uL (ref 0.2–0.9)
NEUTROS PCT: 68 %
Neutro Abs: 3.5 10*3/uL (ref 1.4–6.5)
PLATELETS: 135 10*3/uL — AB (ref 150–440)
RBC: 4.19 MIL/uL (ref 3.80–5.20)
RDW: 16.1 % — AB (ref 11.5–14.5)
WBC: 5.2 10*3/uL (ref 3.6–11.0)

## 2015-05-12 LAB — TROPONIN I

## 2015-05-12 MED ORDER — BLOOD GLUCOSE MONITOR KIT: PACK | Status: DC

## 2015-05-12 NOTE — ED Provider Notes (Signed)
[facility] Emergency Department Provider Note     Time seen: ----------------------------------------- 10:53 AM on 05/12/2015 -----------------------------------------    I have reviewed the triage vital signs and the nursing notes.   HISTORY  Chief Complaint Hypoglycemia    HPI Tanya Montgomery is a 75 y.o. female who presents ER being brought by EMS for hypoglycemia. EMS was called out for strokelike symptoms, she could not speak. On evaluation patient blood sugar of 35, D50 was given by EMS. She is a DO NOT RESUSCITATE, has multiple old CVAs. This point patient denies complaints other than having to urinate.   Past Medical History  Diagnosis Date  . HTN (hypertension)   . Heart attack ([place])   . Diabetes insipidus ([place])   . High cholesterol   . Kidney disease   . Sleep apnea   . Breast cancer ([place]) 2000   . Heart murmur   . COPD (chronic obstructive pulmonary disease) ([place])   . GERD (gastroesophageal reflux disease)   . Thyroid nodule 2014  . Stroke [facility])     TIA  . Diabetes mellitus without complication ([place])   . CHF (congestive heart failure) ([place])   . Asthma     Patient Active Problem List   Diagnosis Date Noted  . Acute CVA (cerebrovascular accident) ([place]) 01/11/2015  . CVA (cerebral infarction) 12/24/2014  . Solitary pulmonary nodule 03/16/2013  . Breast cancer ([place])   . Thyroid nodule   . Diabetes mellitus without complication ([place])   . CHF (congestive heart failure) ([place])   . Asthma   . Hypertension 03/20/2012  . Chronic cough 12/15/2010    Past Surgical History  Procedure Laterality Date  . Total abdominal hysterectomy    . Appendectomy    . Carotid stent    . Tonsillectomy and adenoidectomy    . Coronary angioplasty  1998  . Parathyroidectomy Right 2002    Right inferior gland  . Breast surgery Left 2001  . Cholecystectomy      2004  . Nissen fundoplication N/A [id number]  . Mastectomy Left 2001     Allergies Levaquin  Social History Social History  Substance Use Topics  . Smoking status: Never Smoker   . Smokeless tobacco: Never Used  . Alcohol Use: No     Comment: Occasional drinker    Review of Systems Constitutional: Negative for fever. Eyes: Negative for visual changes. ENT: Negative for sore throat. Cardiovascular: Negative for chest pain. Respiratory: Negative for shortness of breath. Gastrointestinal: Negative for abdominal pain, vomiting and diarrhea. Genitourinary: Negative for dysuria. Musculoskeletal: Negative for back pain. Skin: Negative for rash. Neurological: Negative for headaches, focal weakness or numbness.  10-point ROS otherwise negative.  ____________________________________________   PHYSICAL EXAM:  VITAL SIGNS: ED Triage Vitals  Enc Vitals Group     BP --      Pulse --      Resp --      Temp --      Temp src --      SpO2 --      Weight --      Height --      Head Cir --      Peak Flow --      Pain Score --      Pain Loc --      Pain Edu? --      Excl. in [place]? --     Constitutional: Alert and oriented. Well appearing and in no distress. Eyes: Conjunctivae are  normal. PERRL. Normal extraocular movements. ENT   Head: Normocephalic and atraumatic.   Nose: No congestion/rhinnorhea.   Mouth/Throat: Mucous membranes are moist.   Neck: No stridor. Cardiovascular: Normal rate, regular rhythm. Normal and symmetric distal pulses are present in all extremities. No murmurs, rubs, or gallops. Respiratory: Normal respiratory effort without tachypnea nor retractions. Breath sounds are clear and equal bilaterally. No wheezes/rales/rhonchi. Gastrointestinal: Soft and nontender. No distention. No abdominal bruits.  Musculoskeletal: Nontender with normal range of motion in all extremities. No joint effusions.  No lower extremity tenderness nor edema. Neurologic:  Normal speech and language. No gross focal neurologic deficits are  appreciated. Speech is normal.  Skin:  Skin is warm, dry and intact. No rash noted. Psychiatric: Mood and affect are normal. Speech and behavior are normal. Patient exhibits appropriate insight and judgment. ____________________________________________  EKG: Interpreted by me. Normal sinus rhythm rate of 65 bpm, normal PR interval, normal QRS with, LVH  ____________________________________________  ED COURSE:  Pertinent labs & imaging results that were available during my care of the patient were reviewed by me and considered in my medical decision making (see chart for details). Patient is in no acute distress at this time, we will recheck basic labs and reevaluate. ____________________________________________    LABS (pertinent positives/negatives)  Labs Reviewed  CBC WITH DIFFERENTIAL/PLATELET - Abnormal; Notable for the following:    RDW 16.1 (*)    Platelets 135 (*)    All other components within normal limits  COMPREHENSIVE METABOLIC PANEL - Abnormal; Notable for the following:    Chloride 100 (*)    Glucose, Bld 154 (*)    BUN 33 (*)    Creatinine, Ser 1.32 (*)    Calcium 8.2 (*)    GFR calc non Af Amer 38 (*)    GFR calc Af Amer 44 (*)    All other components within normal limits  URINALYSIS COMPLETEWITH MICROSCOPIC ([facility] ONLY) - Abnormal; Notable for the following:    Color, Urine YELLOW (*)    APPearance CLEAR (*)    All other components within normal limits  GLUCOSE, CAPILLARY - Abnormal; Notable for the following:    Glucose-Capillary 111 (*)    All other components within normal limits  GLUCOSE, CAPILLARY - Abnormal; Notable for the following:    Glucose-Capillary 62 (*)    All other components within normal limits  TROPONIN I  CBG MONITORING, ED  CBG MONITORING, ED   ____________________________________________  FINAL ASSESSMENT AND PLAN  Hypoglycemia  Plan: Patient with labs and imaging as dictated above. Patient with mild hypoglycemia of unclear  etiology. Labs and urinalysis are grossly unremarkable. Would advise decreasing Lantus at night by 2 units. She is stable for outpatient follow-up.   Earleen Newport, MD   Earleen Newport, MD 05/12/15 [phone]

## 2015-05-12 NOTE — Discharge Instructions (Signed)

## 2015-05-12 NOTE — ED Notes (Signed)
Pt arrives via [facility] for hypoglycemia.  EMS was called out for "stroke-like" symptoms.  Upon evaluation, pt had glucose of 35.  D-50 given by EMS.  Pt has previous hx of CVA.

## 2015-05-12 NOTE — ED Notes (Signed)
Pt presents via EMS for hypoglycemia. Pt has hx of CVA. Reports not feeling well around 0200.

## 2015-08-31 ENCOUNTER — Emergency Department
Admission: EM | Admit: 2015-08-31 | Discharge: 2015-08-31 | Disposition: A | Discharge status: Home / Self Care | Payer: Medicare HMO | Attending: Emergency Medicine | Admitting: Emergency Medicine

## 2015-08-31 ENCOUNTER — Emergency Department: Payer: Medicare HMO

## 2015-08-31 DIAGNOSIS — Z791 Long term (current) use of non-steroidal anti-inflammatories (NSAID): Secondary | ICD-10-CM | POA: Diagnosis not present

## 2015-08-31 DIAGNOSIS — I509 Heart failure, unspecified: Secondary | ICD-10-CM | POA: Diagnosis not present

## 2015-08-31 DIAGNOSIS — I110 Hypertensive heart disease with heart failure: Secondary | ICD-10-CM | POA: Diagnosis not present

## 2015-08-31 DIAGNOSIS — Z7984 Long term (current) use of oral hypoglycemic drugs: Secondary | ICD-10-CM | POA: Diagnosis not present

## 2015-08-31 DIAGNOSIS — E119 Type 2 diabetes mellitus without complications: Secondary | ICD-10-CM | POA: Insufficient documentation

## 2015-08-31 DIAGNOSIS — Z8673 Personal history of transient ischemic attack (TIA), and cerebral infarction without residual deficits: Secondary | ICD-10-CM | POA: Diagnosis not present

## 2015-08-31 DIAGNOSIS — J45909 Unspecified asthma, uncomplicated: Secondary | ICD-10-CM | POA: Insufficient documentation

## 2015-08-31 DIAGNOSIS — E162 Hypoglycemia, unspecified: Secondary | ICD-10-CM

## 2015-08-31 DIAGNOSIS — J449 Chronic obstructive pulmonary disease, unspecified: Secondary | ICD-10-CM | POA: Insufficient documentation

## 2015-08-31 DIAGNOSIS — Z853 Personal history of malignant neoplasm of breast: Secondary | ICD-10-CM | POA: Insufficient documentation

## 2015-08-31 DIAGNOSIS — Z9861 Coronary angioplasty status: Secondary | ICD-10-CM | POA: Insufficient documentation

## 2015-08-31 DIAGNOSIS — E232 Diabetes insipidus: Secondary | ICD-10-CM | POA: Insufficient documentation

## 2015-08-31 DIAGNOSIS — Z79899 Other long term (current) drug therapy: Secondary | ICD-10-CM | POA: Diagnosis not present

## 2015-08-31 DIAGNOSIS — I208 Other forms of angina pectoris: Secondary | ICD-10-CM | POA: Diagnosis not present

## 2015-08-31 DIAGNOSIS — Z794 Long term (current) use of insulin: Secondary | ICD-10-CM | POA: Insufficient documentation

## 2015-08-31 DIAGNOSIS — R200 Anesthesia of skin: Secondary | ICD-10-CM | POA: Diagnosis present

## 2015-08-31 LAB — URINALYSIS COMPLETE WITH MICROSCOPIC (ARMC ONLY)
Bacteria, UA: NONE SEEN
Bilirubin Urine: NEGATIVE
Glucose, UA: NEGATIVE mg/dL
Hgb urine dipstick: NEGATIVE
Ketones, ur: NEGATIVE mg/dL
Leukocytes, UA: NEGATIVE
Nitrite: NEGATIVE
PROTEIN: NEGATIVE mg/dL
RBC / HPF: NONE SEEN RBC/hpf (ref 0–5)
SPECIFIC GRAVITY, URINE: 1.004 — AB (ref 1.005–1.030)
pH: 6 (ref 5.0–8.0)

## 2015-08-31 LAB — COMPREHENSIVE METABOLIC PANEL
ALBUMIN: 4.2 g/dL (ref 3.5–5.0)
ALT: 22 U/L (ref 14–54)
AST: 29 U/L (ref 15–41)
Alkaline Phosphatase: 58 U/L (ref 38–126)
Anion gap: 10 (ref 5–15)
BUN: 21 mg/dL — AB (ref 6–20)
CHLORIDE: 101 mmol/L (ref 101–111)
CO2: 29 mmol/L (ref 22–32)
CREATININE: 1 mg/dL (ref 0.44–1.00)
Calcium: 9.3 mg/dL (ref 8.9–10.3)
GFR calc Af Amer: >60 mL/min (ref >60)
GFR calc non Af Amer: 53 mL/min — ABNORMAL LOW (ref >60)
Glucose, Bld: 149 mg/dL — ABNORMAL HIGH (ref 65–99)
Potassium: 3.6 mmol/L (ref 3.5–5.1)
SODIUM: 140 mmol/L (ref 135–145)
Total Bilirubin: 0.5 mg/dL (ref 0.3–1.2)
Total Protein: 6.9 g/dL (ref 6.5–8.1)

## 2015-08-31 LAB — GLUCOSE, CAPILLARY: GLUCOSE-CAPILLARY: 103 mg/dL — AB (ref 65–99)

## 2015-08-31 LAB — TROPONIN I

## 2015-08-31 LAB — CBC WITH DIFFERENTIAL/PLATELET
BASOS ABS: 0 10*3/uL (ref 0–0.1)
BASOS PCT: 1 %
EOS PCT: 1 %
Eosinophils Absolute: 0.1 10*3/uL (ref 0–0.7)
HCT: 38.8 % (ref 35.0–47.0)
Hemoglobin: 13.1 g/dL (ref 12.0–16.0)
LYMPHS PCT: 26 %
Lymphs Abs: 1.5 10*3/uL (ref 1.0–3.6)
MCH: 32.3 pg (ref 26.0–34.0)
MCHC: 33.7 g/dL (ref 32.0–36.0)
MCV: 96 fL (ref 80.0–100.0)
Monocytes Absolute: 0.4 10*3/uL (ref 0.2–0.9)
Monocytes Relative: 7 %
Neutro Abs: 3.7 10*3/uL (ref 1.4–6.5)
Neutrophils Relative %: 65 %
PLATELETS: 152 10*3/uL (ref 150–440)
RBC: 4.04 MIL/uL (ref 3.80–5.20)
RDW: 14 % (ref 11.5–14.5)
WBC: 5.7 10*3/uL (ref 3.6–11.0)

## 2015-08-31 LAB — LIPASE, BLOOD: Lipase: 25 U/L (ref 11–51)

## 2015-08-31 MED ORDER — SODIUM CHLORIDE 0.9 % IV BOLUS (SEPSIS)
1000.0000 mL | Freq: Once | INTRAVENOUS | Status: AC
  Administered 2015-08-31: 1000 mL via INTRAVENOUS

## 2015-08-31 MED ORDER — FUROSEMIDE 40 MG PO TABS
20.0000 mg | ORAL_TABLET | Freq: Once | ORAL | Status: AC
  Administered 2015-08-31: 20 mg via ORAL
  Filled 2015-08-31: qty 1

## 2015-08-31 NOTE — Discharge Instructions (Signed)
Angina Pectoris  Angina pectoris, often called angina, is extreme discomfort in the chest, neck, or arm. This is caused by a lack of blood in the middle and thickest layer of the heart wall (myocardium). There are four types of angina:  · Stable angina. Stable angina usually occurs in episodes of predictable frequency and duration. It is usually brought on by physical activity, stress, or excitement. Stable angina usually lasts a few minutes and can often be relieved by a medicine that you place under your tongue. This medicine is called sublingual nitroglycerin.  · Unstable angina. Unstable angina can occur even when you are doing little or no physical activity. It can even occur while you are sleeping or when you are at rest. It can suddenly increase in severity or frequency. It may not be relieved by sublingual nitroglycerin, and it can last up to 30 minutes.  · Microvascular angina. This type of angina is caused by a disorder of tiny blood vessels called arterioles. Microvascular angina is more common in women. The pain may be more severe and last longer than other types of angina pectoris.  · Prinzmetal or variant angina. This type of angina pectoris is rare and usually occurs when you are doing little or no physical activity. It especially occurs in the early morning hours.  CAUSES  Atherosclerosis is the cause of angina. This is the buildup of fat and cholesterol (plaque) on the inside of the arteries. Over time, the plaque may narrow or block the artery, and this will lessen blood flow to the heart. Plaque can also become weak and break off within a coronary artery to form a clot and cause a sudden blockage.  RISK FACTORS  Risk factors common to both men and women include:  · High cholesterol levels.  · High blood pressure (hypertension).  · Tobacco use.  · Diabetes.  · Family history of angina.  · Obesity.  · Lack of exercise.  · A diet high in saturated fats.  Women are at greater risk for angina if they  are:  · Over age 55.  · Postmenopausal.  SYMPTOMS  Many people do not experience any symptoms during the early stages of angina. As the condition progresses, symptoms common to both men and women may include:  · Chest pain.    The pain can be described as a crushing or squeezing in the chest, or a tightness, pressure, fullness, or heaviness in the chest.    The pain can last more than a few minutes, or it can stop and recur.  · Pain in the arms, neck, jaw, or back.  · Unexplained heartburn or indigestion.  · Shortness of breath.  · Nausea.  · Sudden cold sweats.  · Sudden light-headedness.  Many women have chest discomfort and some of the other symptoms. However, women often have different (atypical) symptoms, such as:   · Fatigue.  · Unexplained feelings of nervousness or anxiety.  · Unexplained weakness.  · Dizziness or fainting.  Sometimes, women may have angina without any symptoms.  DIAGNOSIS   Tests to diagnose angina may include:  · ECG (electrocardiogram).  · Exercise stress test. This looks for signs of blockage when the heart is being exercised.  · Pharmacologic stress test. This test looks for signs of blockage when the heart is being stressed with a medicine.  · Blood tests.  · Coronary angiogram. This is a procedure to look at the coronary arteries to see if there is any blockage.    surgery. This will allow your blood to pass the blockage (bypass) to reach your heart. HOME CARE INSTRUCTIONS   Take medicines only as directed by your health care provider.  Do not take the following medicines unless your health care provider approves:  Nonsteroidal anti-inflammatory drugs (NSAIDs), such as ibuprofen,  naproxen, or celecoxib.  Vitamin supplements that contain vitamin A, vitamin E, or both.  Hormone replacement therapy that contains estrogen with or without progestin.  Manage other health conditions such as hypertension and diabetes as directed by your health care provider.  Follow a heart-healthy diet. A dietitian can help to educate you about healthy food options and changes.  Use healthy cooking methods such as roasting, grilling, broiling, baking, poaching, steaming, or stir-frying. Talk to a dietitian to learn more about healthy cooking methods.  Follow an exercise program approved by your health care provider.  Maintain a healthy weight. Lose weight as approved by your health care provider.  Plan rest periods when fatigued.  Learn to manage stress.  Do not use any tobacco products, including cigarettes, chewing tobacco, or electronic cigarettes. If you need help quitting, ask your health care provider.  If you drink alcohol, and your health care provider approves, limit your alcohol intake to no more than 1 drink per day. One drink equals 12 ounces of beer, 5 ounces of wine, or 1 ounces of hard liquor.  Stop illegal drug use.  Keep all follow-up visits as directed by your health care provider. This is important. SEEK IMMEDIATE MEDICAL CARE IF:   You have pain in your chest, neck, arm, jaw, stomach, or back that lasts more than a few minutes, is recurring, or is unrelieved by taking sublingualnitroglycerin.  You have profuse sweating without cause.  You have unexplained:  Heartburn or indigestion.  Shortness of breath or difficulty breathing.  Nausea or vomiting.  Fatigue.  Feelings of nervousness or anxiety.  Weakness.  Diarrhea.  You have sudden light-headedness or dizziness.  You faint. These symptoms may represent a serious problem that is an emergency. Do not wait to see if the symptoms will go away. Get medical help right away. Call your local  emergency services (911 in the [place] Do not drive yourself to the hospital.   This information is not intended to replace advice given to you by your health care provider. Make sure you discuss any questions you have with your health care provider.   Document Released: 04/23/2005 Document Revised: 05/14/2014 Document Reviewed: 08/25/2013 Elsevier Interactive Patient Education 2016 [employer]  Hypoglycemia Hypoglycemia occurs when the glucose in your blood is too low. Glucose is a type of sugar that is your body's main energy source. Hormones, such as insulin and glucagon, control the level of glucose in the blood. Insulin lowers blood glucose and glucagon increases blood glucose. Having too much insulin in your blood stream, or not eating enough food containing sugar, can result in hypoglycemia. Hypoglycemia can happen to people with or without diabetes. It can develop quickly and can be a medical emergency.  CAUSES   Missing or delaying meals.  Not eating enough carbohydrates at meals.  Taking too much diabetes medicine.  Not timing your oral diabetes medicine or insulin doses with meals, snacks, and exercise.  Nausea and vomiting.  Certain medicines.  Severe illnesses, such as hepatitis, kidney disorders, and certain eating disorders.  Increased activity or exercise without eating something extra or adjusting medicines.  Drinking too much alcohol.  A nerve disorder that affects body functions  like your heart rate, blood pressure, and digestion (autonomic neuropathy).  A condition where the stomach muscles do not function properly (gastroparesis). Therefore, medicines and food may not absorb properly.  Rarely, a tumor of the pancreas can produce too much insulin. SYMPTOMS   Hunger.  Sweating (diaphoresis).  Change in body temperature.  Shakiness.  Headache.  Anxiety.  Lightheadedness.  Irritability.  Difficulty concentrating.  Dry mouth.  Tingling or  numbness in the hands or feet.  Restless sleep or sleep disturbances.  Altered speech and coordination.  Change in mental status.  Seizures or prolonged convulsions.  Combativeness.  Drowsiness (lethargic).  Weakness.  Increased heart rate or palpitations.  Confusion.  Pale, gray skin color.  Blurred or double vision.  Fainting. DIAGNOSIS  A physical exam and medical history will be performed. Your caregiver may make a diagnosis based on your symptoms. Blood tests and other lab tests may be performed to confirm a diagnosis. Once the diagnosis is made, your caregiver will see if your signs and symptoms go away once your blood glucose is raised.  TREATMENT  Usually, you can easily treat your hypoglycemia when you notice symptoms.  Check your blood glucose. If it is less than 70 mg/dl, take one of the following:   3-4 glucose tablets.    cup juice.    cup regular soda.   1 cup skim milk.   -1 tube of glucose gel.   5-6 hard candies.   Avoid high-fat drinks or food that may delay a rise in blood glucose levels.  Do not take more than the recommended amount of sugary foods, drinks, gel, or tablets. Doing so will cause your blood glucose to go too high.   Wait 10-15 minutes and recheck your blood glucose. If it is still less than 70 mg/dl or below your target range, repeat treatment.   Eat a snack if it is more than 1 hour until your next meal.  There may be a time when your blood glucose may go so low that you are unable to treat yourself at home when you start to notice symptoms. You may need someone to help you. You may even faint or be unable to swallow. If you cannot treat yourself, someone will need to bring you to the hospital.  Tanya Montgomery  If you have diabetes, follow your diabetes management plan by:  Taking your medicines as directed.  Following your exercise plan.  Following your meal plan. Do not skip meals. Eat on  time.  Testing your blood glucose regularly. Check your blood glucose before and after exercise. If you exercise longer or different than usual, be sure to check blood glucose more frequently.  Wearing your medical alert jewelry that says you have diabetes.  Identify the cause of your hypoglycemia. Then, develop ways to prevent the recurrence of hypoglycemia.  Do not take a hot bath or shower right after an insulin shot.  Always carry treatment with you. Glucose tablets are the easiest to carry.  If you are going to drink alcohol, drink it only with meals.  Tell friends or family members ways to keep you safe during a seizure. This may include removing hard or sharp objects from the area or turning you on your side.  Maintain a healthy weight. SEEK MEDICAL CARE IF:   You are having problems keeping your blood glucose in your target range.  You are having frequent episodes of hypoglycemia.  You feel you might be having side effects from  your medicines.  You are not sure why your blood glucose is dropping so low.  You notice a change in vision or a new problem with your vision. SEEK IMMEDIATE MEDICAL CARE IF:   Confusion develops.  A change in mental status occurs.  The inability to swallow develops.  Fainting occurs.   This information is not intended to replace advice given to you by your health care provider. Make sure you discuss any questions you have with your health care provider.   Document Released: 04/23/2005 Document Revised: 04/28/2013 Document Reviewed: 12/28/2014 Elsevier Interactive Patient Education [employer].

## 2015-08-31 NOTE — ED Provider Notes (Signed)
[facility] Emergency Department Provider Note  ____________________________________________  Time seen: 10:55 AM  I have reviewed the triage vital signs and the nursing notes.   HISTORY  Chief Complaint Weakness and Chest Pain    HPI Tanya Montgomery is a 75 y.o. female brought to the ED due to facial tingling and numbness and left arm tingling. The patient was in her usual state of health this morning when she woke up. She did not eat breakfast and has been fasting this morning due to getting some blood work this morning at 8 AM. She is diabetic on insulin and has still been taking her full dose of insulin last night.  After getting her blood drawn, she had some chest pressure that was nonradiating without diaphoresis vomiting or shortness of breath. It was not exertional, not pleuritic. She took her usual nitroglycerin and the pain resolved. It lasted for only a minute or so. She is not concerned about it.  She also developed numbness and tingling of the whole face and the left arm. This mirrors a previous stroke she has had. She did not have any motor weakness. She was found to have a blood sugar of 45, and so EMS gave her intramuscular glucagon. Patient reports that time she arrives here in the emergency department, the paresthesia symptoms have resolved. She now feels back to normal and asymptomatic.     Past Medical History  Diagnosis Date  . HTN (hypertension)   . Heart attack ([place])   . Diabetes insipidus ([place])   . High cholesterol   . Kidney disease   . Sleep apnea   . Breast cancer ([place]) 2000   . Heart murmur   . COPD (chronic obstructive pulmonary disease) ([place])   . GERD (gastroesophageal reflux disease)   . Thyroid nodule 2014  . Stroke [facility])     TIA  . Diabetes mellitus without complication ([place])   . CHF (congestive heart failure) ([place])   . Asthma      Patient Active Problem List   Diagnosis Date Noted  . Acute CVA  (cerebrovascular accident) ([place]) 01/11/2015  . CVA (cerebral infarction) 12/24/2014  . Solitary pulmonary nodule 03/16/2013  . Breast cancer ([place])   . Thyroid nodule   . Diabetes mellitus without complication ([place])   . CHF (congestive heart failure) ([place])   . Asthma   . Hypertension 03/20/2012  . Chronic cough 12/15/2010     Past Surgical History  Procedure Laterality Date  . Total abdominal hysterectomy    . Appendectomy    . Carotid stent    . Tonsillectomy and adenoidectomy    . Coronary angioplasty  1998  . Parathyroidectomy Right 2002    Right inferior gland  . Breast surgery Left 2001  . Cholecystectomy      2004  . Nissen fundoplication N/A [id number]  . Mastectomy Left 2001     Current Outpatient Rx  Name  Route  Sig  Dispense  Refill  . apixaban (ELIQUIS) 5 MG TABS tablet   Oral   Take 1 tablet (5 mg total) by mouth 2 (two) times daily.   60 tablet   0     PLEASE STOP Aspirin when starting this. Patient sh ...   . atenolol (TENORMIN) 25 MG tablet   Oral   Take 25 mg by mouth 2 (two) times daily.         Marland Kitchen atorvastatin (LIPITOR) 40 MG tablet   Oral   Take 1  tablet (40 mg total) by mouth daily at 6 PM. Patient taking differently: Take 40 mg by mouth daily.    30 tablet   0   . BIOTIN PO   Oral   Take 1 capsule by mouth daily.         . digoxin (LANOXIN) 0.25 MG tablet   Oral   Take 0.125-0.25 mg by mouth daily. Pt takes 0.125mg  on Monday, Wednesday, and Friday.   Pt takes 0.25mg  on Tuesday, Thursday, Saturday, and Sunday.         . esomeprazole (NEXIUM) 40 MG capsule   Oral   Take 40 mg by mouth daily.          Marland Kitchen glipiZIDE (GLUCOTROL XL) 10 MG 24 hr tablet   Oral   Take 10 mg by mouth daily.          . hydrALAZINE (APRESOLINE) 25 MG tablet   Oral   Take 25 mg by mouth 2 (two) times daily.         . Insulin Degludec (TRESIBA FLEXTOUCH) 100 UNIT/ML SOPN   Subcutaneous   Inject 40 Units into the skin at bedtime.         .  isosorbide dinitrate (ISORDIL) 30 MG tablet   Oral   Take 45 mg by mouth 2 (two) times daily.          . Multiple Vitamins-Minerals (MULTIVITAMIN WITH MINERALS) tablet   Oral   Take 1 tablet by mouth daily.           . naproxen sodium (ANAPROX) 220 MG tablet   Oral   Take 220 mg by mouth as needed.         Marland Kitchen omega-3 acid ethyl esters (LOVAZA) 1 G capsule   Oral   Take 2 g by mouth 2 (two) times daily.         . potassium chloride (KLOR-CON) 10 MEQ CR tablet   Oral   Take 10 mEq by mouth daily. Pt takes on mon. Jeris Penta. fri.         . sertraline (ZOLOFT) 50 MG tablet   Oral   Take 50 mg by mouth daily.         . sitaGLIPtin-metformin (JANUMET) 50-1000 MG tablet   Oral   Take 1 tablet by mouth daily.         Marland Kitchen spironolactone (ALDACTONE) 25 MG tablet   Oral   Take 25 mg by mouth daily.         Marland Kitchen torsemide (DEMADEX) 20 MG tablet   Oral   Take 40 mg by mouth daily.         . traMADol (ULTRAM) 50 MG tablet   Oral   Take 50 mg by mouth daily as needed.         . valACYclovir (VALTREX) 1000 MG tablet   Oral   Take 1,000 mg by mouth daily.         . valsartan (DIOVAN) 160 MG tablet   Oral   Take 160 mg by mouth daily.              Allergies Levaquin   Family History  Problem Relation Age of Onset  . Emphysema Father   . Lung cancer Father   . Cancer Father     Thyroid  . Heart disease Mother     died age 76  . Brain cancer Son     died age 48    Social History  Social History  Substance Use Topics  . Smoking status: Never Smoker   . Smokeless tobacco: Never Used  . Alcohol Use: No     Comment: Occasional drinker    Review of Systems  Constitutional:   No fever or chills.  Eyes:   No vision changes.  ENT:   No sore throat. No rhinorrhea. Cardiovascular:   Positive as above chest pain. Respiratory:   No dyspnea or cough. Gastrointestinal:   Negative for abdominal pain, vomiting and diarrhea.  Genitourinary:   Negative for  dysuria or difficulty urinating. Musculoskeletal:   Negative for focal pain or swelling Neurological:   Negative for headaches. Positive paresthesias above 10-point ROS otherwise negative.  ____________________________________________   PHYSICAL EXAM:  VITAL SIGNS: ED Triage Vitals  Enc Vitals Group     BP 08/31/15 1105 157/73 mmHg     Pulse Rate 08/31/15 1105 65     Resp 08/31/15 1105 20     Temp 08/31/15 1105 98.2 F (36.8 C)     Temp Source 08/31/15 1105 Oral     SpO2 08/31/15 1105 95 %     Weight 08/31/15 1105 136 lb (61.689 kg)     Height 08/31/15 1105 5\' 2"  (1.575 m)     Head Cir --      Peak Flow --      Pain Score 08/31/15 1126 4     Pain Loc --      Pain Edu? --      Excl. in [place]? --     Vital signs reviewed, nursing assessments reviewed.   Constitutional:   Alert and oriented. Well appearing and in no distress. Eyes:   No scleral icterus. No conjunctival pallor. PERRL. EOMI.  No nystagmus. ENT   Head:   Normocephalic and atraumatic.   Nose:   No congestion/rhinnorhea. No septal hematoma   Mouth/Throat:   MMM, no pharyngeal erythema. No peritonsillar mass.    Neck:   No stridor. No SubQ emphysema. No meningismus. Hematological/Lymphatic/Immunilogical:   No cervical lymphadenopathy. Cardiovascular:   RRR. Symmetric bilateral radial and DP pulses.  No murmurs.  Respiratory:   Normal respiratory effort without tachypnea nor retractions. Breath sounds are clear and equal bilaterally. No wheezes/rales/rhonchi. Gastrointestinal:   Soft and nontender. Non distended. There is no CVA tenderness.  No rebound, rigidity, or guarding. Genitourinary:   deferred Musculoskeletal:   Nontender with normal range of motion in all extremities. No joint effusions.  No lower extremity tenderness.  No edema. Neurologic:   Normal speech and language.  CN 2-10 normal. Motor grossly intact. No gross focal neurologic deficits are appreciated.  Skin:    Skin is warm, dry and  intact. No rash noted.  No petechiae, purpura, or bullae. No sacral skin breakdown.  ____________________________________________    LABS (pertinent positives/negatives) (all labs ordered are listed, but only abnormal results are displayed) Labs Reviewed  COMPREHENSIVE METABOLIC PANEL - Abnormal; Notable for the following:    Glucose, Bld 149 (*)    BUN 21 (*)    GFR calc non Af Amer 53 (*)    All other components within normal limits  URINALYSIS COMPLETEWITH MICROSCOPIC ([facility] ONLY) - Abnormal; Notable for the following:    Color, Urine STRAW (*)    APPearance CLEAR (*)    Specific Gravity, Urine 1.004 (*)    Squamous Epithelial / LPF 0-5 (*)    All other components within normal limits  GLUCOSE, CAPILLARY - Abnormal; Notable for the following:  Glucose-Capillary 103 (*)    All other components within normal limits  LIPASE, BLOOD  CBC WITH DIFFERENTIAL/PLATELET  TROPONIN I  TROPONIN I  CBG MONITORING, ED   ____________________________________________   EKG  Interpreted by me Normal sinus rhythm rate of 67, normal axis and intervals. Normal QRS ST segments and T waves.  ____________________________________________    RADIOLOGY  CT head unremarkable  ____________________________________________   PROCEDURES   ____________________________________________   INITIAL IMPRESSION / ASSESSMENT AND PLAN / ED COURSE  Pertinent labs & imaging results that were available during my care of the patient were reviewed by me and considered in my medical decision making (see chart for details).  Patient well appearing no acute distress. Fingerstick glucose 100 on arrival. Symptoms resolved. By history this is entirely consistent with hypoglycemia from fasting while taking insulin which is caused a reemergence of previous stroke symptoms. Low suspicion for new acute stroke. Low suspicion for ACS PE or dissection. Check CT head and labs including 2 troponins which was all  unremarkable. Patient remains calm and comfortable, asymptomatic, eating. I'll discharge home to follow up with primary care. Return precautions given.     ____________________________________________   FINAL CLINICAL IMPRESSION(S) / ED DIAGNOSES  Final diagnoses:  Hypoglycemia  Stable angina (HCC)       Portions of this note were generated with dragon dictation software. Dictation errors may occur despite best attempts at proofreading.   Carrie Mew, MD 08/31/15 [id number]

## 2015-08-31 NOTE — ED Notes (Signed)
Pt comes into the ED via EMS from home with cf/o left arm and face tingling numbness that started at 8am after going to have routine labs drawn this morning. Pt also c/o having chest pressure. EMS report pt FS 45, gave pt glucagon in route, pt states the numbness as resolved.. States she only had a pack of crackers this morning.Marland Kitchen

## 2015-08-31 NOTE — ED Notes (Signed)
Family member notified this RN that patients face is flushed. Skin is WDI and pink. Temp rechecked 97.9.

## 2015-08-31 NOTE — ED Notes (Signed)
Patient transported to CT 

## 2015-12-06 ENCOUNTER — Other Ambulatory Visit: Payer: Self-pay | Admitting: Physician Assistant

## 2015-12-06 DIAGNOSIS — Z1231 Encounter for screening mammogram for malignant neoplasm of breast: Secondary | ICD-10-CM

## 2016-03-23 ENCOUNTER — Ambulatory Visit: Payer: Medicare Other

## 2016-08-13 ENCOUNTER — Emergency Department
Admission: EM | Admit: 2016-08-13 | Discharge: 2016-08-13 | Disposition: A | Discharge status: Home / Self Care | Payer: Medicare HMO | Attending: Emergency Medicine | Admitting: Emergency Medicine

## 2016-08-13 ENCOUNTER — Emergency Department: Payer: Medicare HMO

## 2016-08-13 ENCOUNTER — Encounter: Payer: Self-pay | Admitting: Emergency Medicine

## 2016-08-13 DIAGNOSIS — E119 Type 2 diabetes mellitus without complications: Secondary | ICD-10-CM | POA: Diagnosis not present

## 2016-08-13 DIAGNOSIS — R51 Headache: Secondary | ICD-10-CM | POA: Diagnosis not present

## 2016-08-13 DIAGNOSIS — R519 Headache, unspecified: Secondary | ICD-10-CM

## 2016-08-13 DIAGNOSIS — J449 Chronic obstructive pulmonary disease, unspecified: Secondary | ICD-10-CM | POA: Diagnosis not present

## 2016-08-13 DIAGNOSIS — Z79899 Other long term (current) drug therapy: Secondary | ICD-10-CM | POA: Diagnosis not present

## 2016-08-13 DIAGNOSIS — I509 Heart failure, unspecified: Secondary | ICD-10-CM | POA: Insufficient documentation

## 2016-08-13 DIAGNOSIS — Z853 Personal history of malignant neoplasm of breast: Secondary | ICD-10-CM | POA: Insufficient documentation

## 2016-08-13 DIAGNOSIS — J45909 Unspecified asthma, uncomplicated: Secondary | ICD-10-CM | POA: Diagnosis not present

## 2016-08-13 DIAGNOSIS — Z794 Long term (current) use of insulin: Secondary | ICD-10-CM | POA: Diagnosis not present

## 2016-08-13 DIAGNOSIS — I110 Hypertensive heart disease with heart failure: Secondary | ICD-10-CM | POA: Diagnosis not present

## 2016-08-13 LAB — BASIC METABOLIC PANEL
ANION GAP: 6 (ref 5–15)
BUN: 14 mg/dL (ref 6–20)
CALCIUM: 9.2 mg/dL (ref 8.9–10.3)
CO2: 26 mmol/L (ref 22–32)
CREATININE: 0.92 mg/dL (ref 0.44–1.00)
Chloride: 105 mmol/L (ref 101–111)
GFR, EST NON AFRICAN AMERICAN: 59 mL/min — AB (ref >60)
Glucose, Bld: 219 mg/dL — ABNORMAL HIGH (ref 65–99)
Potassium: 4.2 mmol/L (ref 3.5–5.1)
Sodium: 137 mmol/L (ref 135–145)

## 2016-08-13 LAB — CBC
HCT: 38.9 % (ref 35.0–47.0)
Hemoglobin: 13.1 g/dL (ref 12.0–16.0)
MCH: 30 pg (ref 26.0–34.0)
MCHC: 33.7 g/dL (ref 32.0–36.0)
MCV: 89.2 fL (ref 80.0–100.0)
PLATELETS: 157 10*3/uL (ref 150–440)
RBC: 4.36 MIL/uL (ref 3.80–5.20)
RDW: 14 % (ref 11.5–14.5)
WBC: 6 10*3/uL (ref 3.6–11.0)

## 2016-08-13 MED ORDER — DIPHENHYDRAMINE HCL 50 MG/ML IJ SOLN
12.5000 mg | Freq: Once | INTRAMUSCULAR | Status: AC
  Administered 2016-08-13: 12.5 mg via INTRAVENOUS
  Filled 2016-08-13: qty 1

## 2016-08-13 MED ORDER — ATENOLOL 25 MG PO TABS: 25.0000 mg | ORAL_TABLET | Freq: Once | ORAL | Status: DC

## 2016-08-13 MED ORDER — METOCLOPRAMIDE HCL 5 MG/ML IJ SOLN
10.0000 mg | Freq: Once | INTRAMUSCULAR | Status: AC
  Administered 2016-08-13: 10 mg via INTRAVENOUS
  Filled 2016-08-13: qty 2

## 2016-08-13 MED ORDER — SODIUM CHLORIDE 0.9 % IV BOLUS (SEPSIS)
1000.0000 mL | Freq: Once | INTRAVENOUS | Status: AC
  Administered 2016-08-13: 1000 mL via INTRAVENOUS

## 2016-08-13 MED ORDER — KETOROLAC TROMETHAMINE 30 MG/ML IJ SOLN
15.0000 mg | Freq: Once | INTRAMUSCULAR | Status: AC
  Administered 2016-08-13: 15 mg via INTRAVENOUS
  Filled 2016-08-13: qty 1

## 2016-08-13 MED ORDER — HYDRALAZINE HCL 50 MG PO TABS: 25.0000 mg | ORAL_TABLET | Freq: Once | ORAL | Status: DC

## 2016-08-13 MED ORDER — ISOSORBIDE DINITRATE 30 MG PO TABS
30.0000 mg | ORAL_TABLET | Freq: Once | ORAL | Status: DC
  Filled 2016-08-13: qty 1

## 2016-08-13 MED ORDER — IOPAMIDOL (ISOVUE-370) INJECTION 76%
75.0000 mL | Freq: Once | INTRAVENOUS | Status: AC | PRN
  Administered 2016-08-13: 75 mL via INTRAVENOUS

## 2016-08-13 NOTE — ED Notes (Signed)
MD aware of BP, pt has not had daily HTN meds, Son states he wants to give them to her from home

## 2016-08-13 NOTE — ED Provider Notes (Signed)
[facility] Emergency Department Provider Note  Time seen: 3:50 PM  I have reviewed the triage vital signs and the nursing notes.   HISTORY  Chief Complaint Headache    HPI Tanya Montgomery is a 76 y.o. female with a past medical history of CHF, COPD, diabetes, gastric reflux, hypertension, hyperlipidemia, CVA, presents to the emergency department with left-sided headache. According to the patient for the past 2 days she has had a throbbing headache located behind her left ear traveling up to the top of her head. States it has been fairly constant. Currently describes as a 6/10. Denies nausea, vomiting, weakness, numbness, confusion, slurred speech, fever, neck pain. Patient states she has had headaches lasting several days in the past but not for many years per patient. No formal diagnosis of migraines per patient. The patient does take Eliquis due to prior CVAs.   Past Medical History:  Diagnosis Date  . Asthma   . Breast cancer ([place]) 2000   . CHF (congestive heart failure) ([place])   . COPD (chronic obstructive pulmonary disease) ([place])   . Diabetes insipidus ([place])   . Diabetes mellitus without complication ([place])   . GERD (gastroesophageal reflux disease)   . Heart attack   . Heart murmur   . High cholesterol   . HTN (hypertension)   . Kidney disease   . Sleep apnea   . Stroke [facility])    TIA  . Thyroid nodule 2014    Patient Active Problem List   Diagnosis Date Noted  . Acute CVA (cerebrovascular accident) ([place]) 01/11/2015  . CVA (cerebral infarction) 12/24/2014  . Solitary pulmonary nodule 03/16/2013  . Breast cancer ([place])   . Thyroid nodule   . Diabetes mellitus without complication ([place])   . CHF (congestive heart failure) ([place])   . Asthma   . Hypertension 03/20/2012  . Chronic cough 12/15/2010    Past Surgical History:  Procedure Laterality Date  . APPENDECTOMY    . BREAST SURGERY Left 2001  . CAROTID STENT    . CHOLECYSTECTOMY     2004   . CORONARY ANGIOPLASTY  1998  . MASTECTOMY Left 2001  . NISSEN FUNDOPLICATION N/A 9326  . PARATHYROIDECTOMY Right 2002   Right inferior gland  . TONSILLECTOMY AND ADENOIDECTOMY    . TOTAL ABDOMINAL HYSTERECTOMY      Prior to Admission medications   Medication Sig Start Date End Date Taking? Authorizing Provider  apixaban (ELIQUIS) 5 MG TABS tablet Take 1 tablet (5 mg total) by mouth 2 (two) times daily. 12/30/14   Bettey Costa, MD  atenolol (TENORMIN) 25 MG tablet Take 25 mg by mouth 2 (two) times daily.    Historical Provider, MD  atorvastatin (LIPITOR) 40 MG tablet Take 1 tablet (40 mg total) by mouth daily at 6 PM. Patient taking differently: Take 40 mg by mouth daily.  12/25/14   Bettey Costa, MD  BIOTIN PO Take 1 capsule by mouth daily.    Historical Provider, MD  digoxin (LANOXIN) 0.25 MG tablet Take 0.125-0.25 mg by mouth daily. Pt takes 0.125mg  on Monday, Wednesday, and Friday.   Pt takes 0.25mg  on Tuesday, Thursday, Saturday, and Sunday.    Historical Provider, MD  esomeprazole (NEXIUM) 40 MG capsule Take 40 mg by mouth daily.     Historical Provider, MD  glipiZIDE (GLUCOTROL XL) 10 MG 24 hr tablet Take 10 mg by mouth daily.     Historical Provider, MD  hydrALAZINE (APRESOLINE) 25 MG tablet Take 25 mg  by mouth 2 (two) times daily.    Historical Provider, MD  Insulin Degludec (TRESIBA FLEXTOUCH) 100 UNIT/ML SOPN Inject 40 Units into the skin at bedtime.    Historical Provider, MD  isosorbide dinitrate (ISORDIL) 30 MG tablet Take 45 mg by mouth 2 (two) times daily.     Historical Provider, MD  Multiple Vitamins-Minerals (MULTIVITAMIN WITH MINERALS) tablet Take 1 tablet by mouth daily.      Historical Provider, MD  naproxen sodium (ANAPROX) 220 MG tablet Take 220 mg by mouth as needed.    Historical Provider, MD  omega-3 acid ethyl esters (LOVAZA) 1 G capsule Take 2 g by mouth 2 (two) times daily.    Historical Provider, MD  potassium chloride (KLOR-CON) 10 MEQ CR tablet Take 10 mEq by  mouth daily. Pt takes on mon. Jeris Penta. fri.    Historical Provider, MD  sertraline (ZOLOFT) 50 MG tablet Take 50 mg by mouth daily.    Historical Provider, MD  sitaGLIPtin-metformin (JANUMET) 50-1000 MG tablet Take 1 tablet by mouth daily.    Historical Provider, MD  spironolactone (ALDACTONE) 25 MG tablet Take 25 mg by mouth daily.    Historical Provider, MD  torsemide (DEMADEX) 20 MG tablet Take 40 mg by mouth daily.    Historical Provider, MD  traMADol (ULTRAM) 50 MG tablet Take 50 mg by mouth daily as needed.    Historical Provider, MD  valACYclovir (VALTREX) 1000 MG tablet Take 1,000 mg by mouth daily.    Historical Provider, MD  valsartan (DIOVAN) 160 MG tablet Take 160 mg by mouth daily.      Historical Provider, MD    Allergies  Allergen Reactions  . Levaquin [Levofloxacin In D5w] Other (See Comments)    Reaction:  Tachycardia    Family History  Problem Relation Age of Onset  . Emphysema Father   . Lung cancer Father   . Cancer Father     Thyroid  . Heart disease Mother     died age 69  . Brain cancer Son     died age 35    Social History Social History  Substance Use Topics  . Smoking status: Never Smoker  . Smokeless tobacco: Never Used  . Alcohol use No     Comment: Occasional drinker    Review of Systems Constitutional: Negative for fever Cardiovascular: Negative for chest pain. Respiratory: Negative for shortness of breath. Gastrointestinal: Negative for abdominal pain Neurological:  moderate headache. Denies focal weakness or numbness.10-point ROS otherwise negative.  ____________________________________________   PHYSICAL EXAM:  VITAL SIGNS: ED Triage Vitals [08/13/16 1304]  Enc Vitals Group     BP (!) 178/82     Pulse Rate 75     Resp 16     Temp 98 F (36.7 C)     Temp Source Oral     SpO2 98 %     Weight      Height      Head Circumference      Peak Flow      Pain Score 7     Pain Loc      Pain Edu?      Excl. in [place]?      Constitutional: Alert and oriented. Well appearing and in no distress. Eyes: Normal exam ENT   Head: Normocephalic and atraumatic.   Mouth/Throat: Mucous membranes are moist. Cardiovascular: Normal rate, regular rhythm. No murmur Respiratory: Normal respiratory effort without tachypnea nor retractions. Breath sounds are clear Gastrointestinal: Soft and nontender. No  distention.  Musculoskeletal: Nontender with normal range of motion in all extremities. Neurologic:  Normal speech and language.  cranial nerves intact. Patient does have left-sided extremity deficits from a prior CVA. Denies any changes from baseline.  Skin:  Skin is warm, dry and intact.  Psychiatric: Mood and affect are normal. Speech and behavior are normal.   ____________________________________________   RADIOLOGY  CT head shows old infarcts but no acute abnormality.  IMPRESSION: 1. Advanced intracranial atherosclerosis as above, including severe right supraclinoid ICA, distal right M1, bilateral A2, and bilateral proximal PCA stenoses. 2. 6 mm left paraophthalmic ICA aneurysm. 3. 2 mm anterior communicating region aneurysm.  ____________________________________________   INITIAL IMPRESSION / ASSESSMENT AND PLAN / ED COURSE  Pertinent labs & imaging results that were available during my care of the patient were reviewed by me and considered in my medical decision making (see chart for details).  The patient presents to the emergency department a moderate headache for the past 48 hours. Overall the patient appears well, no distress. Son and patient state no change in neurological deficits from baseline. Overall the patient appears well. Given the patient's throbbing nature of her headache we will also obtain a CT angiography of the brain. We will treat with Toradol Reglan and Benadryl (low doses).  Patient states her headache nearly went away but then started to come back. CT angiography shows 2  smaller aneurysms a 6 mm to 2 mm. No rupture noted. Given the patient's continued headache with aneurysm on CT angiography discussed the patient with Dr. Lacinda Axon of neurosurgery. He recommended admitting the patient to the hospital to take her off of her blood thinner and then obtaining a lumbar puncture to rule out bleed. I discussed this with the patient she adamantly does not want a lumbar puncture performed. She understands the risks of a bleed. She has capacity to make this decision and is refusing at this time. She states the headache is much better than it was when she came in. Son is here with her, and agrees with her decision to avoid lumbar puncture. I discussed the pros and cons. Patient wishes to be discharged from the emergency department. She is quite hypertensive currently 178/82, but has not yet taken her nighttime meds and will do so when she returns home.  ____________________________________________   FINAL CLINICAL IMPRESSION(S) / ED DIAGNOSES  Headache    Harvest Dark, MD 08/13/16 [phone]

## 2016-08-13 NOTE — ED Notes (Signed)
Pt changed, clean and dry at this time.

## 2016-08-13 NOTE — Discharge Instructions (Signed)
As we discussed her headache worsens please return to the emergency department. 2 small aneurysms were found on your CT scan today. Please follow-up with neurosurgery by calling the number provided. Return to the emergency department for any worsening pain, weakness or numbness, confusion or slurred speech.   IMPRESSION: 1. Advanced intracranial atherosclerosis as above, including severe right supraclinoid ICA, distal right M1, bilateral A2, and bilateral proximal PCA stenoses. 2. 6 mm left paraophthalmic ICA aneurysm. 3. 2 mm anterior communicating region aneurysm.

## 2016-08-13 NOTE — ED Triage Notes (Signed)
Says sudden onset headache left side and up to left side top of head.  Kept her up all night with pain.  She is on eloquis.

## 2016-10-15 ENCOUNTER — Ambulatory Visit: Payer: Medicare HMO | Attending: Neurology | Admitting: Speech Pathology

## 2016-10-15 DIAGNOSIS — R41841 Cognitive communication deficit: Secondary | ICD-10-CM | POA: Diagnosis present

## 2016-10-16 ENCOUNTER — Encounter: Payer: Self-pay | Admitting: Speech Pathology

## 2016-10-16 NOTE — Therapy (Signed)
[place] MAIN [facility] SERVICES [address] [place], [place], [address] Phone: [phone]   Fax:  [phone]  Speech Language Pathology Evaluation  Patient Details  Name: Tanya Montgomery MRN: [id number] Date of Birth: Jul 02, 1940 Referring Provider: Jennings Books K  Encounter Date: 10/15/2016      End of Session - 10/16/16 1104    Visit Number 1   Number of Visits 13   Date for SLP Re-Evaluation 12/15/16   SLP Start Time 1600   SLP Stop Time  1650   SLP Time Calculation (min) 50 min   Activity Tolerance Patient tolerated treatment well      Past Medical History:  Diagnosis Date  . Asthma   . Breast cancer ([place]) 2000   . CHF (congestive heart failure) ([place])   . COPD (chronic obstructive pulmonary disease) ([place])   . Diabetes insipidus ([place])   . Diabetes mellitus without complication ([place])   . GERD (gastroesophageal reflux disease)   . Heart attack ([place])   . Heart murmur   . High cholesterol   . HTN (hypertension)   . Kidney disease   . Sleep apnea   . Stroke [facility])    TIA  . Thyroid nodule 2014    Past Surgical History:  Procedure Laterality Date  . APPENDECTOMY    . BREAST SURGERY Left 2001  . CAROTID STENT    . CHOLECYSTECTOMY     2004  . CORONARY ANGIOPLASTY  1998  . MASTECTOMY Left 2001  . NISSEN FUNDOPLICATION N/A 8921  . PARATHYROIDECTOMY Right 2002   Right inferior gland  . TONSILLECTOMY AND ADENOIDECTOMY    . TOTAL ABDOMINAL HYSTERECTOMY      There were no vitals filed for this visit.          SLP Evaluation [facility] - 10/16/16 0001      SLP Visit Information   SLP Received On 10/15/16   Referring Provider [facility], [facility] K   Onset Date 09/13/2016   Medical Diagnosis Mild Cognitive Imapirment     Subjective   Subjective "Is there a course of study to help with cognitive problems"   Patient/Family Stated Goal Improved cognitive function     General Information   HPI MRI September/2016 shows  posterior right frontal lobe stroke and older left posterior frontal lobe stroke.  Neurology notes:  Mild cognitive impairment vs early dementia - worse. - Recommended to wear hearing aids at all times as hearing loss can contribute to difficulty understanding conversations and recording memory. Recommended for patient to use subtitles on the TV. Also discussed a house phone which uses voice recognition technology to display in text what the other party is saying. - Discussed language comprehension difficulty which can be caused by dementia and mimic hearing loss.     Prior Functional Status   Cognitive/Linguistic Baseline Within functional limits     Auditory Comprehension   Overall Auditory Comprehension Appears within functional limits for tasks assessed   Overall Auditory Comprehension Comments Requires some cuing to attend to auditory stimuli     Reading Comprehension   Reading Status Within funtional limits  Requires some cues to attend to written information     Expression   Primary Mode of Expression Verbal     Verbal Expression   Overall Verbal Expression Appears within functional limits for tasks assessed   Other Verbal Expression Comments Reduced abstraction and abstract language     Written Expression   Written Expression Within Functional Limits  Oral Motor/Sensory Function   Overall Oral Motor/Sensory Function Appears within functional limits for tasks assessed     Motor Speech   Overall Motor Speech Appears within functional limits for tasks assessed     Standardized Assessments   Standardized Assessments  Montreal Cognitive Assessment (MOCA);Western Aphasia Battery revised      Montreal Cognitive Assessment (MOCA) Version: 8.1 Visuospatieal/Executive Alternating trail making       1/1 Visuoconstruction Skills (copy 3-d design) 0/1 Draw a clock     3/3 Naming     3/3 Attention Forward digit span    1/1 Backward digit span    1/1 Vigilance     1/1 Serial  7's     3/3 Language  Verbal Fluency     0/1 Repetition     1/2 Abstraction     1/2 Delayed Recall    2/5  Memory Index Score   11/15 Orientation     3/6 TOTAL      20/30       Normal  ? 26/30        Western Aphasia Battery- Screening  Spontaneous Speech      Information content   10/10       Fluency    10/10     Comprehension     Yes/No questions   9/10 10/10 given cue to focus/attend         Sequential Commands  10/10    Repetition    10/10     Naming    Object Naming   10/10        Screening Aphasia Quotient 98/100   Reading    7.7/10    10/10 given cue to focus/attend  Writing    10/10  Screening Language Quotient 96/100       SLP Education - 10/16/16 1104    Education provided Yes   Education Details Role of SLP in cognitive therapy   Person(s) Educated Patient   Methods Explanation   Comprehension Verbalized understanding            SLP Long Term Goals - 10/16/16 1128      SLP LONG TERM GOAL #1   Title Patient will demonstrate functional cognitive-communication skills for independent completion of personal responsibilities and leisure activities.   Time 6   Period Weeks   Status New     SLP LONG TERM GOAL #2   Title Patient will complete attention, executive function skills, and memory strategy activities with 80% accuracy.   Time 6   Period Weeks   Status New     SLP LONG TERM GOAL #3   Title Patient will identify cognitive barriers and participate in developing functional compensatory strategies.   Time 6   Period Weeks   Status New          Plan - 10/16/16 1126    Clinical Impression Statement This 76 year old woman, S/P multiple strokes and mild cognitive impairment, is presenting with moderate cognitive communication deficits characterized by reduced attention, short term memory impairment, reduced abstraction, increased processing time, and reduced executive skills.  Oral and written language skills are intact, given cues to attend  to attend to auditory and written information.  The patient will benefit from skilled speech therapy for restorative and compensatory treatment of cognitive deficits.   Speech Therapy Frequency 2x / week   Duration Other (comment)  6 weeks   Treatment/Interventions Cognitive reorganization;Internal/external aids;SLP instruction and feedback;Patient/family education;Functional tasks   Potential to  Achieve Goals Good   Potential Considerations Ability to learn/carryover information;Co-morbidities;Cooperation/participation level;Medical prognosis;Pain level;Severity of impairments;Family/community support   SLP Home Exercise Plan To be determined   Consulted and Agree with Plan of Care Patient      Patient will benefit from skilled therapeutic intervention in order to improve the following deficits and impairments:   Cognitive communication deficit - Plan: SLP plan of care cert/re-cert      G-Codes - 28/63/81 1130    Functional Assessment Tool Used MOCA, WAB- screening, clinical judgment   Functional Limitations Attention   Attention Current Status (R7116) At least 40 percent but less than 60 percent impaired, limited or restricted   Attention Goal Status (F7903) At least 20 percent but less than 40 percent impaired, limited or restricted      Problem List Patient Active Problem List   Diagnosis Date Noted  . Acute CVA (cerebrovascular accident) ([place]) 01/11/2015  . CVA (cerebral infarction) 12/24/2014  . Solitary pulmonary nodule 03/16/2013  . Breast cancer ([place])   . Thyroid nodule   . Diabetes mellitus without complication ([place])   . CHF (congestive heart failure) ([place])   . Asthma   . Hypertension 03/20/2012  . Chronic cough 12/15/2010   Leroy Sea, MS/CCC- SLP  Lou Miner 10/16/2016, 11:32 AM  [place] MAIN [facility] SERVICES [address] [place], [place], [address] Phone: [phone]   Fax:  [phone]  Name: Tanya Montgomery MRN: [id number] Date of Birth: [birth year]

## 2016-10-17 ENCOUNTER — Ambulatory Visit: Payer: Medicare HMO | Admitting: Speech Pathology

## 2016-10-17 DIAGNOSIS — R41841 Cognitive communication deficit: Secondary | ICD-10-CM

## 2016-10-18 ENCOUNTER — Encounter: Payer: Self-pay | Admitting: Speech Pathology

## 2016-10-18 NOTE — Therapy (Signed)
[place] MAIN [facility] SERVICES [address] [place], [place], [address] Phone: [phone]   Fax:  [phone]  Speech Language Pathology Treatment  Patient Details  Name: Tanya Montgomery MRN: [id number] Date of Birth: 10/01/1940 Referring Provider: Jennings Books K  Encounter Date: 10/17/2016      End of Session - 10/18/16 0842    Visit Number 2   Number of Visits 13   Date for SLP Re-Evaluation 12/15/16   SLP Start Time 6606   SLP Stop Time  3016   SLP Time Calculation (min) 55 min   Activity Tolerance Patient tolerated treatment well      Past Medical History:  Diagnosis Date  . Asthma   . Breast cancer ([place]) 2000   . CHF (congestive heart failure) ([place])   . COPD (chronic obstructive pulmonary disease) ([place])   . Diabetes insipidus ([place])   . Diabetes mellitus without complication ([place])   . GERD (gastroesophageal reflux disease)   . Heart attack ([place])   . Heart murmur   . High cholesterol   . HTN (hypertension)   . Kidney disease   . Sleep apnea   . Stroke [facility])    TIA  . Thyroid nodule 2014    Past Surgical History:  Procedure Laterality Date  . APPENDECTOMY    . BREAST SURGERY Left 2001  . CAROTID STENT    . CHOLECYSTECTOMY     2004  . CORONARY ANGIOPLASTY  1998  . MASTECTOMY Left 2001  . NISSEN FUNDOPLICATION N/A 0109  . PARATHYROIDECTOMY Right 2002   Right inferior gland  . TONSILLECTOMY AND ADENOIDECTOMY    . TOTAL ABDOMINAL HYSTERECTOMY      There were no vitals filed for this visit.      Subjective Assessment - 10/18/16 0842    Subjective Patient showed up early and ready to work.   Currently in Pain? No/denies               ADULT SLP TREATMENT - 10/18/16 0001      General Information   Behavior/Cognition Alert;Cooperative;Pleasant mood     Treatment Provided   Treatment provided Cognitive-Linquistic     Pain Assessment   Pain Assessment No/denies pain     Cognitive-Linquistic  Treatment   Treatment focused on Cognition   Skilled Treatment COGNITIVE BARRIERS: Patient requires frequent attention redirection to complete functional tasks. INTERVENTION STRATEGIES: Patient completed Exercise 1 and 2 in the [facility] workbook. Patient required some cueing in order to get started on answering the questions in the workbook. Clinician also had to frequently redirect her attention because the patient skipped some questions. Clinician used scaffolding techniques to simplify the information presented in the workbook.     Assessment / Recommendations / Plan   Plan Continue with current plan of care     Progression Toward Goals   Progression toward goals Progressing toward goals          SLP Education - 10/18/16 0842    Education provided Yes   Education Details Memory strategies   Person(s) Educated Patient   Methods Explanation   Comprehension Verbalized understanding            SLP Long Term Goals - 10/16/16 1128      SLP LONG TERM GOAL #1   Title Patient will demonstrate functional cognitive-communication skills for independent completion of personal responsibilities and leisure activities.   Time 6   Period Weeks   Status New  SLP LONG TERM GOAL #2   Title Patient will complete attention, executive function skills, and memory strategy activities with 80% accuracy.   Time 6   Period Weeks   Status New     SLP LONG TERM GOAL #3   Title Patient will identify cognitive barriers and participate in developing functional compensatory strategies.   Time 6   Period Weeks   Status New          Plan - 10/18/16 0843    Clinical Impression Statement Patient was able to complete the functional exercises with moderate cueing from the clinician. The patient's main cognitive barrier is her ability to attend to large amounts of information. In future sessions, the clinician will use more strategies to minimize the amount of information the patient  receives at one time and the patient will continue to work through the [place].   Speech Therapy Frequency 2x / week   Duration Other (comment)   Treatment/Interventions Cognitive reorganization;Internal/external aids;SLP instruction and feedback;Patient/family education;Functional tasks   Potential to Achieve Goals Good   Potential Considerations Ability to learn/carryover information;Co-morbidities;Cooperation/participation level;Medical prognosis;Pain level;Severity of impairments;Family/community support   SLP Home Exercise Plan Make use of memory aids such as calendar and planner   Consulted and Agree with Plan of Care Patient      Patient will benefit from skilled therapeutic intervention in order to improve the following deficits and impairments:   Cognitive communication deficit    Problem List Patient Active Problem List   Diagnosis Date Noted  . Acute CVA (cerebrovascular accident) ([place]) 01/11/2015  . CVA (cerebral infarction) 12/24/2014  . Solitary pulmonary nodule 03/16/2013  . Breast cancer ([place])   . Thyroid nodule   . Diabetes mellitus without complication ([place])   . CHF (congestive heart failure) ([place])   . Asthma   . Hypertension 03/20/2012  . Chronic cough 12/15/2010    Aline August 10/18/2016, 8:44 AM  [place] MAIN [facility] SERVICES [address] [place], [place], [address] Phone: [phone]   Fax:  [phone]   Name: Tanya Montgomery MRN: [id number] Date of Birth: December 17, 1940

## 2016-10-22 ENCOUNTER — Ambulatory Visit: Payer: Medicare HMO | Admitting: Speech Pathology

## 2016-10-22 DIAGNOSIS — R41841 Cognitive communication deficit: Secondary | ICD-10-CM

## 2016-10-23 ENCOUNTER — Encounter: Payer: Self-pay | Admitting: Speech Pathology

## 2016-10-23 NOTE — Therapy (Signed)
[place] MAIN [facility] SERVICES [address] [place], [place], [address] Phone: [phone]   Fax:  [phone]  Speech Language Pathology Treatment  Patient Details  Name: Tanya Montgomery MRN: [id number] Date of Birth: [birth year] Referring Provider: Vladimir Crofts  Encounter Date: 10/22/2016      End of Session - 10/23/16 0831    Visit Number 3   Number of Visits 13   Date for SLP Re-Evaluation 12/15/16   SLP Start Time 58   SLP Stop Time  1700   SLP Time Calculation (min) 60 min   Activity Tolerance Patient tolerated treatment well      Past Medical History:  Diagnosis Date  . Asthma   . Breast cancer ([place]) 2000   . CHF (congestive heart failure) ([place])   . COPD (chronic obstructive pulmonary disease) ([place])   . Diabetes insipidus ([place])   . Diabetes mellitus without complication ([place])   . GERD (gastroesophageal reflux disease)   . Heart attack ([place])   . Heart murmur   . High cholesterol   . HTN (hypertension)   . Kidney disease   . Sleep apnea   . Stroke [facility])    TIA  . Thyroid nodule 2014    Past Surgical History:  Procedure Laterality Date  . APPENDECTOMY    . BREAST SURGERY Left 2001  . CAROTID STENT    . CHOLECYSTECTOMY     2004  . CORONARY ANGIOPLASTY  1998  . MASTECTOMY Left 2001  . NISSEN FUNDOPLICATION N/A 9242  . PARATHYROIDECTOMY Right 2002   Right inferior gland  . TONSILLECTOMY AND ADENOIDECTOMY    . TOTAL ABDOMINAL HYSTERECTOMY      There were no vitals filed for this visit.      Subjective Assessment - 10/23/16 0829    Subjective  Patient worked hard throughout the session.   Currently in Pain? No/denies               ADULT SLP TREATMENT - 10/23/16 0001      General Information   Behavior/Cognition Alert;Cooperative;Pleasant mood     Treatment Provided   Treatment provided Cognitive-Linquistic     Pain Assessment   Pain Assessment No/denies pain     Cognitive-Linquistic  Treatment   Treatment focused on Cognition   Skilled Treatment  COGNITIVE BARRIERS: Patient requires frequent attention redirection to complete functional tasks. INTERVENTION STRATEGIES: Patient completed Exercise 3 in the [facility] Memory workbook. Patient required some cueing in order to redirect her attention to specific parts of the functional task. The patient skipped some questions. Patient was able to brainstorm uses for the memory strategies she learned about and apply them to real-life situations. Clinician used scaffolding techniques to simplify the information presented in the workbook.     Assessment / Recommendations / Plan   Plan Continue with current plan of care     Progression Toward Goals   Progression toward goals Progressing toward goals          SLP Education - 10/23/16 0830    Education provided Yes   Education Details External memory aids   Person(s) Educated Patient   Methods Explanation   Comprehension Verbalized understanding            SLP Long Term Goals - 10/16/16 1128      SLP LONG TERM GOAL #1   Title Patient will demonstrate functional cognitive-communication skills for independent completion of personal responsibilities and leisure activities.   Time 6  Period Weeks   Status New     SLP LONG TERM GOAL #2   Title Patient will complete attention, executive function skills, and memory strategy activities with 80% accuracy.   Time 6   Period Weeks   Status New     SLP LONG TERM GOAL #3   Title Patient will identify cognitive barriers and participate in developing functional compensatory strategies.   Time 6   Period Weeks   Status New          Plan - 10/23/16 0831    Clinical Impression Statement  Patient was able to complete the functional exercises with moderate cueing from the clinician for attention redirection and simplification of information. In future sessions, the clinician will use more strategies to minimize the amount of  information the patient receives at one time and the patient will continue to work through the [facility].   Speech Therapy Frequency 2x / week   Duration Other (comment)   Treatment/Interventions Cognitive reorganization;Internal/external aids;SLP instruction and feedback;Patient/family education;Functional tasks   Potential to Achieve Goals Good   Potential Considerations Ability to learn/carryover information;Co-morbidities;Cooperation/participation level;Medical prognosis;Pain level;Severity of impairments;Family/community support   SLP Home Exercise Plan Make use of memory aids such as calendar and planner   Consulted and Agree with Plan of Care Patient      Patient will benefit from skilled therapeutic intervention in order to improve the following deficits and impairments:   Cognitive communication deficit    Problem List Patient Active Problem List   Diagnosis Date Noted  . Acute CVA (cerebrovascular accident) ([place]) 01/11/2015  . CVA (cerebral infarction) 12/24/2014  . Solitary pulmonary nodule 03/16/2013  . Breast cancer ([place])   . Thyroid nodule   . Diabetes mellitus without complication ([place])   . CHF (congestive heart failure) ([place])   . Asthma   . Hypertension 03/20/2012  . Chronic cough 12/15/2010    Aline August 10/23/2016, 8:32 AM  [place] MAIN [facility] SERVICES [address] [place], [place], [address] Phone: [phone]   Fax:  [phone]   Name: Tanya Montgomery MRN: [id number] Date of Birth: [birth year]

## 2016-10-24 ENCOUNTER — Ambulatory Visit: Payer: Medicare HMO | Admitting: Speech Pathology

## 2016-10-24 ENCOUNTER — Encounter: Payer: Self-pay | Admitting: Speech Pathology

## 2016-10-24 DIAGNOSIS — R41841 Cognitive communication deficit: Secondary | ICD-10-CM

## 2016-10-24 NOTE — Therapy (Signed)
[place] MAIN [facility] SERVICES [address] [place], [place], [address] Phone: [phone]   Fax:  [phone]  Speech Language Pathology Treatment  Patient Details  Name: Tanya Montgomery MRN: [id number] Date of Birth: 1941/05/02 Referring Provider: Vladimir Crofts  Encounter Date: 10/24/2016      End of Session - 10/24/16 1722    Visit Number 4   Number of Visits 13   Date for SLP Re-Evaluation 12/15/16   SLP Start Time 40   SLP Stop Time  1700   SLP Time Calculation (min) 60 min   Activity Tolerance Patient tolerated treatment well      Past Medical History:  Diagnosis Date  . Asthma   . Breast cancer ([place]) 2000   . CHF (congestive heart failure) ([place])   . COPD (chronic obstructive pulmonary disease) ([place])   . Diabetes insipidus ([place])   . Diabetes mellitus without complication ([place])   . GERD (gastroesophageal reflux disease)   . Heart attack ([place])   . Heart murmur   . High cholesterol   . HTN (hypertension)   . Kidney disease   . Sleep apnea   . Stroke [facility])    TIA  . Thyroid nodule 2014    Past Surgical History:  Procedure Laterality Date  . APPENDECTOMY    . BREAST SURGERY Left 2001  . CAROTID STENT    . CHOLECYSTECTOMY     2004  . CORONARY ANGIOPLASTY  1998  . MASTECTOMY Left 2001  . NISSEN FUNDOPLICATION N/A 6440  . PARATHYROIDECTOMY Right 2002   Right inferior gland  . TONSILLECTOMY AND ADENOIDECTOMY    . TOTAL ABDOMINAL HYSTERECTOMY      There were no vitals filed for this visit.      Subjective Assessment - 10/24/16 1722    Subjective Patient mentioned "this is something I need to do" while reading through examples of external memory aids.   Currently in Pain? No/denies               ADULT SLP TREATMENT - 10/24/16 0001      General Information   Behavior/Cognition Alert;Cooperative;Pleasant mood     Treatment Provided   Treatment provided Cognitive-Linquistic     Pain Assessment    Pain Assessment No/denies pain     Cognitive-Linquistic Treatment   Treatment focused on Cognition   Skilled Treatment COGNITIVE BARRIERS: Patient requires frequent attention redirection to complete functional tasks. INTERVENTION STRATEGIES: Patient reviewed Exercise 3 in the [facility] memory workbook. Patient completed Exercise 4 and part of 5 in the [facility] workbook, focusing on external and internal memory aids. Patient required minimal attention redirection to the functional task and moderate cueing to remind her to use the resources available to her to answer the comprehension questions. Patient was able to brainstorm uses for the external memory aids she learned about and apply them to real-life situations. Clinician used scaffolding techniques to simplify the information presented in the workbook.     Assessment / Recommendations / Plan   Plan Continue with current plan of care     Progression Toward Goals   Progression toward goals Progressing toward goals          SLP Education - 10/24/16 1722    Education provided Yes   Education Details Implementing external memory aids   Person(s) Educated Patient   Methods Explanation   Comprehension Verbalized understanding            SLP Long Term  Goals - 10/16/16 1128      SLP LONG TERM GOAL #1   Title Patient will demonstrate functional cognitive-communication skills for independent completion of personal responsibilities and leisure activities.   Time 6   Period Weeks   Status New     SLP LONG TERM GOAL #2   Title Patient will complete attention, executive function skills, and memory strategy activities with 80% accuracy.   Time 6   Period Weeks   Status New     SLP LONG TERM GOAL #3   Title Patient will identify cognitive barriers and participate in developing functional compensatory strategies.   Time 6   Period Weeks   Status New          Plan - 10/24/16 1722    Clinical Impression Statement  Patient was able to complete the functional tasks with minimal cueing from the clinician for attention redirection and simplification of information. In future sessions, the patient will continue to work through the [employer]. The patient will learn more about internal memory aids in the next session.   Speech Therapy Frequency 2x / week   Duration Other (comment)   Treatment/Interventions Cognitive reorganization;Internal/external aids;SLP instruction and feedback;Patient/family education;Functional tasks   Potential to Achieve Goals Good   Potential Considerations Ability to learn/carryover information;Co-morbidities;Cooperation/participation level;Medical prognosis;Pain level;Severity of impairments;Family/community support   SLP Home Exercise Plan Use external memory aids at home.   Consulted and Agree with Plan of Care Patient      Patient will benefit from skilled therapeutic intervention in order to improve the following deficits and impairments:   Cognitive communication deficit    Problem List Patient Active Problem List   Diagnosis Date Noted  . Acute CVA (cerebrovascular accident) ([place]) 01/11/2015  . CVA (cerebral infarction) 12/24/2014  . Solitary pulmonary nodule 03/16/2013  . Breast cancer ([place])   . Thyroid nodule   . Diabetes mellitus without complication ([place])   . CHF (congestive heart failure) ([place])   . Asthma   . Hypertension 03/20/2012  . Chronic cough 12/15/2010    Tanya Montgomery 10/24/2016, 5:23 PM  [place] MAIN [facility] SERVICES [address] [place], [place], [address] Phone: [phone]   Fax:  [phone]   Name: Tanya Montgomery MRN: [id number] Date of Birth: 30-Jul-1940

## 2016-10-29 ENCOUNTER — Encounter: Payer: Self-pay | Admitting: Speech Pathology

## 2016-10-29 ENCOUNTER — Ambulatory Visit: Payer: Medicare HMO | Admitting: Speech Pathology

## 2016-10-29 DIAGNOSIS — R41841 Cognitive communication deficit: Secondary | ICD-10-CM | POA: Diagnosis not present

## 2016-10-29 NOTE — Therapy (Signed)
[place] MAIN [facility] SERVICES [address] [place], [place], [address] Phone: [phone]   Fax:  [phone]  Speech Language Pathology Treatment  Patient Details  Name: Tanya Montgomery MRN: [id number] Date of Birth: 1941-01-15 Referring Provider: Vladimir Crofts  Encounter Date: 10/29/2016      End of Session - 10/29/16 1704    Visit Number 5   Number of Visits 13   Date for SLP Re-Evaluation 12/15/16   SLP Start Time 1545   SLP Stop Time  1645   SLP Time Calculation (min) 60 min   Activity Tolerance Patient tolerated treatment well      Past Medical History:  Diagnosis Date  . Asthma   . Breast cancer ([place]) 2000   . CHF (congestive heart failure) ([place])   . COPD (chronic obstructive pulmonary disease) ([place])   . Diabetes insipidus ([place])   . Diabetes mellitus without complication ([place])   . GERD (gastroesophageal reflux disease)   . Heart attack ([place])   . Heart murmur   . High cholesterol   . HTN (hypertension)   . Kidney disease   . Sleep apnea   . Stroke [facility])    TIA  . Thyroid nodule 2014    Past Surgical History:  Procedure Laterality Date  . APPENDECTOMY    . BREAST SURGERY Left 2001  . CAROTID STENT    . CHOLECYSTECTOMY     2004  . CORONARY ANGIOPLASTY  1998  . MASTECTOMY Left 2001  . NISSEN FUNDOPLICATION N/A 1287  . PARATHYROIDECTOMY Right 2002   Right inferior gland  . TONSILLECTOMY AND ADENOIDECTOMY    . TOTAL ABDOMINAL HYSTERECTOMY      There were no vitals filed for this visit.      Subjective Assessment - 10/29/16 1703    Subjective Patient arrived early and worked hard throughout the session.   Currently in Pain? No/denies               ADULT SLP TREATMENT - 10/29/16 0001      General Information   Behavior/Cognition Alert;Cooperative;Pleasant mood     Treatment Provided   Treatment provided Cognitive-Linquistic     Pain Assessment   Pain Assessment No/denies pain     Cognitive-Linquistic Treatment   Treatment focused on Cognition   Skilled Treatment COGNITIVE BARRIERS: Patient requires frequent attention redirection to complete functional tasks. INTERVENTION STRATEGIES: Patient reviewed Exercises 4 and 5 (external and internal memory aids) in the [employer] Patient completed Exercises 6 and 7 in the [facility] workbook, focusing on two types of internal memory strategies: visual imagery and association. Patient required minimal attention redirection to the functional task. Patient was able to brainstorm uses for the internal memory strategies she learned about and apply them to real-life situations.      Assessment / Recommendations / Plan   Plan Continue with current plan of care     Progression Toward Goals   Progression toward goals Progressing toward goals          SLP Education - 10/29/16 1703    Education provided Yes   Education Details Implementing internal memory strategies   Person(s) Educated Patient   Methods Explanation   Comprehension Verbalized understanding            SLP Long Term Goals - 10/16/16 1128      SLP LONG TERM GOAL #1   Title Patient will demonstrate functional cognitive-communication skills for independent completion of personal  responsibilities and leisure activities.   Time 6   Period Weeks   Status New     SLP LONG TERM GOAL #2   Title Patient will complete attention, executive function skills, and memory strategy activities with 80% accuracy.   Time 6   Period Weeks   Status New     SLP LONG TERM GOAL #3   Title Patient will identify cognitive barriers and participate in developing functional compensatory strategies.   Time 6   Period Weeks   Status New          Plan - 10/29/16 1704    Clinical Impression Statement  Patient mentioned that she bought a planner to use as an external memory aid. Patient was able to complete the functional tasks with minimal cueing from the  clinician for attention redirection. Patient was eager to try the visual imagery and association strategies to help her remember birthdays, anniversaries, and new information she reads. In future sessions, the patient will continue to work through the [employer].    Speech Therapy Frequency 2x / week   Duration Other (comment)   Treatment/Interventions Cognitive reorganization;Internal/external aids;SLP instruction and feedback;Patient/family education;Functional tasks   Potential to Achieve Goals Good   Potential Considerations Ability to learn/carryover information;Co-morbidities;Cooperation/participation level;Medical prognosis;Pain level;Severity of impairments;Family/community support   SLP Home Exercise Plan Try out internal memory strategies at home.   Consulted and Agree with Plan of Care Patient      Patient will benefit from skilled therapeutic intervention in order to improve the following deficits and impairments:   Cognitive communication deficit    Problem List Patient Active Problem List   Diagnosis Date Noted  . Acute CVA (cerebrovascular accident) ([place]) 01/11/2015  . CVA (cerebral infarction) 12/24/2014  . Solitary pulmonary nodule 03/16/2013  . Breast cancer ([place])   . Thyroid nodule   . Diabetes mellitus without complication ([place])   . CHF (congestive heart failure) ([place])   . Asthma   . Hypertension 03/20/2012  . Chronic cough 12/15/2010    Aline August 10/29/2016, 5:05 PM  [place] MAIN [facility] SERVICES [address] [place], [place], [address] Phone: [phone]   Fax:  [phone]   Name: Tanya Montgomery MRN: [id number] Date of Birth: January 08, 1941

## 2016-10-31 ENCOUNTER — Ambulatory Visit: Payer: Medicare HMO | Admitting: Speech Pathology

## 2016-10-31 DIAGNOSIS — R41841 Cognitive communication deficit: Secondary | ICD-10-CM

## 2016-11-01 ENCOUNTER — Encounter: Payer: Self-pay | Admitting: Speech Pathology

## 2016-11-01 NOTE — Therapy (Signed)
[place] MAIN [facility] SERVICES [address] [place], [place], [address] Phone: [phone]   Fax:  [phone]  Speech Language Pathology Treatment  Patient Details  Name: Tanya Montgomery MRN: [id number] Date of Birth: February 01, 1941 Referring Provider: Jennings Books K  Encounter Date: 10/31/2016      End of Session - 11/01/16 0930    Visit Number 6   Number of Visits 13   Date for SLP Re-Evaluation 12/15/16   SLP Start Time 1497   SLP Stop Time  1650   SLP Time Calculation (min) 60 min   Activity Tolerance Patient tolerated treatment well      Past Medical History:  Diagnosis Date  . Asthma   . Breast cancer ([place]) 2000   . CHF (congestive heart failure) ([place])   . COPD (chronic obstructive pulmonary disease) ([place])   . Diabetes insipidus ([place])   . Diabetes mellitus without complication ([place])   . GERD (gastroesophageal reflux disease)   . Heart attack ([place])   . Heart murmur   . High cholesterol   . HTN (hypertension)   . Kidney disease   . Sleep apnea   . Stroke [facility])    TIA  . Thyroid nodule 2014    Past Surgical History:  Procedure Laterality Date  . APPENDECTOMY    . BREAST SURGERY Left 2001  . CAROTID STENT    . CHOLECYSTECTOMY     2004  . CORONARY ANGIOPLASTY  1998  . MASTECTOMY Left 2001  . NISSEN FUNDOPLICATION N/A 0263  . PARATHYROIDECTOMY Right 2002   Right inferior gland  . TONSILLECTOMY AND ADENOIDECTOMY    . TOTAL ABDOMINAL HYSTERECTOMY      There were no vitals filed for this visit.      Subjective Assessment - 11/01/16 0929    Subjective Patient said she is trying to use new memory strategies at home.   Currently in Pain? No/denies               ADULT SLP TREATMENT - 11/01/16 0001      General Information   Behavior/Cognition Alert;Cooperative;Pleasant mood     Treatment Provided   Treatment provided Cognitive-Linquistic     Pain Assessment   Pain Assessment No/denies pain     Cognitive-Linquistic Treatment   Treatment focused on Cognition   Skilled Treatment COGNITIVE BARRIERS: Patient requires frequent attention redirection to complete functional tasks. INTERVENTION STRATEGIES: Patient reviewed Exercises 5, 6, and 7 (internal memory aids - visual imagery and association) in the [employer] Patient completed Exercises 8, 9, and 10 in the [facility] workbook, focusing on more internal aids such as first-letter cues, rehearsal, and active observation. Patient required minimal attention redirection to the functional task. Patient was able to brainstorm uses for the internal memory strategies she learned about and apply them to real-life situations.      Assessment / Recommendations / Plan   Plan Continue with current plan of care     Progression Toward Goals   Progression toward goals Progressing toward goals          SLP Education - 11/01/16 0930    Education provided Yes   Education Details Implementing new memory strategies at home   Person(s) Educated Patient   Methods Explanation   Comprehension Verbalized understanding            SLP Long Term Goals - 10/16/16 1128      SLP LONG TERM GOAL #1   Title  Patient will demonstrate functional cognitive-communication skills for independent completion of personal responsibilities and leisure activities.   Time 6   Period Weeks   Status New     SLP LONG TERM GOAL #2   Title Patient will complete attention, executive function skills, and memory strategy activities with 80% accuracy.   Time 6   Period Weeks   Status New     SLP LONG TERM GOAL #3   Title Patient will identify cognitive barriers and participate in developing functional compensatory strategies.   Time 6   Period Weeks   Status New          Plan - 11/01/16 0931    Clinical Impression Statement Patient is attending to the information better and was able to answer questions about topics from the prior session.  Patient is able to draw connections between the strategies presented in the workbook and memory strategies she has used. In future sessions, the patient will continue to work through the [employer].   Speech Therapy Frequency 2x / week   Duration Other (comment)   Treatment/Interventions Cognitive reorganization;Internal/external aids;SLP instruction and feedback;Patient/family education;Functional tasks   Potential to Achieve Goals Good   Potential Considerations Ability to learn/carryover information;Co-morbidities;Cooperation/participation level;Medical prognosis;Pain level;Severity of impairments;Family/community support   SLP Home Exercise Plan Try out new internal memory strategies at home.   Consulted and Agree with Plan of Care Patient      Patient will benefit from skilled therapeutic intervention in order to improve the following deficits and impairments:   Cognitive communication deficit    Problem List Patient Active Problem List   Diagnosis Date Noted  . Acute CVA (cerebrovascular accident) ([place]) 01/11/2015  . CVA (cerebral infarction) 12/24/2014  . Solitary pulmonary nodule 03/16/2013  . Breast cancer ([place])   . Thyroid nodule   . Diabetes mellitus without complication ([place])   . CHF (congestive heart failure) ([place])   . Asthma   . Hypertension 03/20/2012  . Chronic cough 12/15/2010    Aline August 11/01/2016, 9:32 AM  [place] MAIN [facility] SERVICES [address] [place], [place], [address] Phone: [phone]   Fax:  [phone]   Name: MALANA EBERWEIN MRN: [id number] Date of Birth: 1941/03/26

## 2016-11-05 ENCOUNTER — Encounter: Payer: Self-pay | Admitting: Speech Pathology

## 2016-11-05 ENCOUNTER — Ambulatory Visit: Payer: Medicare HMO | Attending: Neurology | Admitting: Speech Pathology

## 2016-11-05 DIAGNOSIS — R41841 Cognitive communication deficit: Secondary | ICD-10-CM | POA: Diagnosis present

## 2016-11-05 NOTE — Therapy (Signed)
[place] MAIN [facility] SERVICES [address] [place], [place], [address] Phone: [phone]   Fax:  [phone]  Speech Language Pathology Treatment  Patient Details  Name: Tanya Montgomery MRN: [id number] Date of Birth: [birth year] Referring Provider: Vladimir Crofts  Encounter Date: 11/05/2016      End of Session - 11/05/16 1706    Visit Number 7   Number of Visits 13   Date for SLP Re-Evaluation 12/15/16   SLP Start Time 1194   SLP Stop Time  1645   SLP Time Calculation (min) 60 min   Activity Tolerance Patient tolerated treatment well      Past Medical History:  Diagnosis Date  . Asthma   . Breast cancer ([place]) 2000   . CHF (congestive heart failure) ([place])   . COPD (chronic obstructive pulmonary disease) ([place])   . Diabetes insipidus ([place])   . Diabetes mellitus without complication ([place])   . GERD (gastroesophageal reflux disease)   . Heart attack ([place])   . Heart murmur   . High cholesterol   . HTN (hypertension)   . Kidney disease   . Sleep apnea   . Stroke [facility])    TIA  . Thyroid nodule 2014    Past Surgical History:  Procedure Laterality Date  . APPENDECTOMY    . BREAST SURGERY Left 2001  . CAROTID STENT    . CHOLECYSTECTOMY     2004  . CORONARY ANGIOPLASTY  1998  . MASTECTOMY Left 2001  . NISSEN FUNDOPLICATION N/A 1740  . PARATHYROIDECTOMY Right 2002   Right inferior gland  . TONSILLECTOMY AND ADENOIDECTOMY    . TOTAL ABDOMINAL HYSTERECTOMY      There were no vitals filed for this visit.      Subjective Assessment - 11/05/16 1705    Subjective Patient has been trying out memory strategies at home.   Currently in Pain? No/denies               ADULT SLP TREATMENT - 11/05/16 0001      General Information   Behavior/Cognition Alert;Cooperative;Pleasant mood     Treatment Provided   Treatment provided Cognitive-Linquistic     Pain Assessment   Pain Assessment No/denies pain     Cognitive-Linquistic Treatment   Treatment focused on Cognition   Skilled Treatment COGNITIVE BARRIERS: Patient requires frequent attention redirection to complete functional tasks. INTERVENTION STRATEGIES: Patient reviewed Exercise 10 (internal memory aids - active observation) in the [employer] Patient completed Exercises 11, 12, and 13 in the [employer] workbook, focusing on more internal aids such as rhyming, categorization, and creating stories. Patient required minimal attention redirection to the functional task. Patient was able to brainstorm uses for the internal memory strategies she learned about and apply them to real-life situations.      Assessment / Recommendations / Plan   Plan Continue with current plan of care     Progression Toward Goals   Progression toward goals Progressing toward goals          SLP Education - 11/05/16 1705    Education provided Yes   Education Details Categorization strategy and lists   Person(s) Educated Patient   Methods Explanation   Comprehension Verbalized understanding            SLP Long Term Goals - 10/16/16 1128      SLP LONG TERM GOAL #1   Title Patient will demonstrate functional cognitive-communication skills for independent completion of personal  responsibilities and leisure activities.   Time 6   Period Weeks   Status New     SLP LONG TERM GOAL #2   Title Patient will complete attention, executive function skills, and memory strategy activities with 80% accuracy.   Time 6   Period Weeks   Status New     SLP LONG TERM GOAL #3   Title Patient will identify cognitive barriers and participate in developing functional compensatory strategies.   Time 6   Period Weeks   Status New          Plan - 11/05/16 1706    Clinical Impression Statement Patient has been using the memory strategies she has learned about in therapy at home and has requested a copy of the worksheets she has completed. Patient  draws connections between memory strategies from previous sessions and the ones being discussed in the current session. In future sessions, the patient will continue to work through the [employer].   Speech Therapy Frequency 2x / week   Duration Other (comment)   Treatment/Interventions Cognitive reorganization;Internal/external aids;SLP instruction and feedback;Patient/family education;Functional tasks   Potential to Achieve Goals Good   Potential Considerations Ability to learn/carryover information;Co-morbidities;Cooperation/participation level;Medical prognosis;Pain level;Severity of impairments;Family/community support   SLP Home Exercise Plan Try out new internal memory strategies at home.   Consulted and Agree with Plan of Care Patient      Patient will benefit from skilled therapeutic intervention in order to improve the following deficits and impairments:   Cognitive communication deficit    Problem List Patient Active Problem List   Diagnosis Date Noted  . Acute CVA (cerebrovascular accident) ([place]) 01/11/2015  . CVA (cerebral infarction) 12/24/2014  . Solitary pulmonary nodule 03/16/2013  . Breast cancer ([place])   . Thyroid nodule   . Diabetes mellitus without complication ([place])   . CHF (congestive heart failure) ([place])   . Asthma   . Hypertension 03/20/2012  . Chronic cough 12/15/2010    Aline August 11/05/2016, 5:07 PM  [place] MAIN [facility] SERVICES [address] [place], [place], [address] Phone: [phone]   Fax:  [phone]   Name: Tanya Montgomery MRN: [id number] Date of Birth: 1940-10-13

## 2016-11-08 ENCOUNTER — Ambulatory Visit: Payer: Medicare HMO | Admitting: Speech Pathology

## 2016-11-12 ENCOUNTER — Encounter: Payer: Self-pay | Admitting: Speech Pathology

## 2016-11-12 ENCOUNTER — Ambulatory Visit: Payer: Medicare HMO | Admitting: Speech Pathology

## 2016-11-12 DIAGNOSIS — R41841 Cognitive communication deficit: Secondary | ICD-10-CM | POA: Diagnosis not present

## 2016-11-12 NOTE — Therapy (Signed)
[place] [facility] SERVICES [address] [place], [place], [address] Phone: [phone]   Fax:  [phone]  Speech Language Pathology Treatment  Patient Details  Name: Tanya Montgomery MRN: [id number] Date of Birth: 27-Aug-1940 Referring Provider: Vladimir Montgomery  Encounter Date: 11/12/2016      End of Session - 11/12/16 1735    Visit Number 8   Number of Visits 13   Date for SLP Re-Evaluation 12/15/16   SLP Start Time 31   SLP Stop Time  1700   SLP Time Calculation (min) 60 min   Activity Tolerance Patient tolerated treatment well      Past Medical History:  Diagnosis Date  . Asthma   . Breast cancer ([place]) 2000   . CHF (congestive heart failure) ([place])   . COPD (chronic obstructive pulmonary disease) ([place])   . Diabetes insipidus ([place])   . Diabetes mellitus without complication ([place])   . GERD (gastroesophageal reflux disease)   . Heart attack ([place])   . Heart murmur   . High cholesterol   . HTN (hypertension)   . Kidney disease   . Sleep apnea   . Stroke [facility])    TIA  . Thyroid nodule 2014    Past Surgical History:  Procedure Laterality Date  . APPENDECTOMY    . BREAST SURGERY Left 2001  . CAROTID STENT    . CHOLECYSTECTOMY     2004  . CORONARY ANGIOPLASTY  1998  . MASTECTOMY Left 2001  . NISSEN FUNDOPLICATION N/A 5631  . PARATHYROIDECTOMY Right 2002   Right inferior gland  . TONSILLECTOMY AND ADENOIDECTOMY    . TOTAL ABDOMINAL HYSTERECTOMY      There were no vitals filed for this visit.      Subjective Assessment - 11/12/16 1735    Subjective Patient worked hard throughout the session.   Currently in Pain? No/denies               ADULT SLP TREATMENT - 11/12/16 0001      General Information   Behavior/Cognition Alert;Cooperative;Pleasant mood     Treatment Provided   Treatment provided Cognitive-Linquistic     Pain Assessment   Pain Assessment No/denies pain     Cognitive-Linquistic  Treatment   Treatment focused on Cognition   Skilled Treatment COGNITIVE BARRIERS: Patient requires frequent attention redirection to complete functional tasks. INTERVENTION STRATEGIES: Patient completed Exercises 14-18 in the [facility] workbook, which focused on remembering to do things, remembering driving routes, remembering new skills, remembering what people say, and remembering names and faces. Patient required minimal attention redirection to the functional task. Patient was able to make connections between the internal memory strategies she learned about prior and the new ones she was learning.      Assessment / Recommendations / Plan   Plan Continue with current plan of care     Progression Toward Goals   Progression toward goals Progressing toward goals          SLP Education - 11/12/16 1735    Education provided Yes   Education Details Using memory strategies in conversations   Person(s) Educated Patient   Methods Explanation   Comprehension Verbalized understanding            SLP Long Term Goals - 10/16/16 1128      SLP LONG TERM GOAL #1   Title Patient will demonstrate functional cognitive-communication skills for independent completion of personal responsibilities and leisure activities.   Time  6   Period Weeks   Status New     SLP LONG TERM GOAL #2   Title Patient will complete attention, executive function skills, and memory strategy activities with 80% accuracy.   Time 6   Period Weeks   Status New     SLP LONG TERM GOAL #3   Title Patient will identify cognitive barriers and participate in developing functional compensatory strategies.   Time 6   Period Weeks   Status New          Plan - 11/12/16 1736    Clinical Impression Statement  Patient continues to use the memory strategies she has learned about in therapy at home and to draw connections between memory strategies from previous sessions and the ones being discussed in the current  session. In future sessions, the patient will continue to work through the [employer].   Speech Therapy Frequency 2x / week   Duration Other (comment)   Treatment/Interventions Cognitive reorganization;Internal/external aids;SLP instruction and feedback;Patient/family education;Functional tasks   Potential to Achieve Goals Good   Potential Considerations Ability to learn/carryover information;Co-morbidities;Cooperation/participation level;Medical prognosis;Pain level;Severity of impairments;Family/community support   SLP Home Exercise Plan Try out new memory strategies at home.   Consulted and Agree with Plan of Care Patient      Patient will benefit from skilled therapeutic intervention in order to improve the following deficits and impairments:   Cognitive communication deficit    Problem List Patient Active Problem List   Diagnosis Date Noted  . Acute CVA (cerebrovascular accident) ([place]) 01/11/2015  . CVA (cerebral infarction) 12/24/2014  . Solitary pulmonary nodule 03/16/2013  . Breast cancer ([place])   . Thyroid nodule   . Diabetes mellitus without complication ([place])   . CHF (congestive heart failure) ([place])   . Asthma   . Hypertension 03/20/2012  . Chronic cough 12/15/2010    Tanya Montgomery 11/12/2016, 5:37 PM  [place] MAIN [facility] SERVICES [address] [place], [place], [address] Phone: [phone]   Fax:  [phone]   Name: [name] MRN: [id number] Date of Birth: 30-Jan-1941

## 2016-11-14 ENCOUNTER — Encounter: Payer: Medicare Other | Admitting: Speech Pathology

## 2016-11-17 ENCOUNTER — Observation Stay
Admission: EM | Admit: 2016-11-17 | Discharge: 2016-11-19 | Disposition: A | Discharge status: Home / Self Care | Payer: Medicare HMO | Attending: Specialist | Admitting: Specialist

## 2016-11-17 ENCOUNTER — Encounter: Payer: Self-pay | Admitting: Emergency Medicine

## 2016-11-17 DIAGNOSIS — I110 Hypertensive heart disease with heart failure: Secondary | ICD-10-CM | POA: Diagnosis not present

## 2016-11-17 DIAGNOSIS — I252 Old myocardial infarction: Secondary | ICD-10-CM | POA: Diagnosis not present

## 2016-11-17 DIAGNOSIS — Z808 Family history of malignant neoplasm of other organs or systems: Secondary | ICD-10-CM | POA: Diagnosis not present

## 2016-11-17 DIAGNOSIS — E7800 Pure hypercholesterolemia, unspecified: Secondary | ICD-10-CM | POA: Insufficient documentation

## 2016-11-17 DIAGNOSIS — J449 Chronic obstructive pulmonary disease, unspecified: Secondary | ICD-10-CM | POA: Insufficient documentation

## 2016-11-17 DIAGNOSIS — Z801 Family history of malignant neoplasm of trachea, bronchus and lung: Secondary | ICD-10-CM | POA: Diagnosis not present

## 2016-11-17 DIAGNOSIS — E119 Type 2 diabetes mellitus without complications: Secondary | ICD-10-CM | POA: Insufficient documentation

## 2016-11-17 DIAGNOSIS — Z794 Long term (current) use of insulin: Secondary | ICD-10-CM | POA: Insufficient documentation

## 2016-11-17 DIAGNOSIS — Z90710 Acquired absence of both cervix and uterus: Secondary | ICD-10-CM | POA: Diagnosis not present

## 2016-11-17 DIAGNOSIS — Z9012 Acquired absence of left breast and nipple: Secondary | ICD-10-CM | POA: Insufficient documentation

## 2016-11-17 DIAGNOSIS — Z825 Family history of asthma and other chronic lower respiratory diseases: Secondary | ICD-10-CM | POA: Insufficient documentation

## 2016-11-17 DIAGNOSIS — Z9049 Acquired absence of other specified parts of digestive tract: Secondary | ICD-10-CM | POA: Diagnosis not present

## 2016-11-17 DIAGNOSIS — K625 Hemorrhage of anus and rectum: Secondary | ICD-10-CM | POA: Diagnosis present

## 2016-11-17 DIAGNOSIS — Z853 Personal history of malignant neoplasm of breast: Secondary | ICD-10-CM | POA: Diagnosis not present

## 2016-11-17 DIAGNOSIS — K922 Gastrointestinal hemorrhage, unspecified: Principal | ICD-10-CM | POA: Insufficient documentation

## 2016-11-17 DIAGNOSIS — E232 Diabetes insipidus: Secondary | ICD-10-CM | POA: Insufficient documentation

## 2016-11-17 DIAGNOSIS — Z7901 Long term (current) use of anticoagulants: Secondary | ICD-10-CM | POA: Diagnosis not present

## 2016-11-17 DIAGNOSIS — Z79899 Other long term (current) drug therapy: Secondary | ICD-10-CM | POA: Diagnosis not present

## 2016-11-17 DIAGNOSIS — K219 Gastro-esophageal reflux disease without esophagitis: Secondary | ICD-10-CM | POA: Diagnosis not present

## 2016-11-17 DIAGNOSIS — Z8673 Personal history of transient ischemic attack (TIA), and cerebral infarction without residual deficits: Secondary | ICD-10-CM | POA: Diagnosis not present

## 2016-11-17 DIAGNOSIS — E890 Postprocedural hypothyroidism: Secondary | ICD-10-CM | POA: Insufficient documentation

## 2016-11-17 LAB — CBC
HEMATOCRIT: 37.6 % (ref 35.0–47.0)
Hemoglobin: 13.1 g/dL (ref 12.0–16.0)
MCH: 30 pg (ref 26.0–34.0)
MCHC: 34.8 g/dL (ref 32.0–36.0)
MCV: 86 fL (ref 80.0–100.0)
PLATELETS: 185 10*3/uL (ref 150–440)
RBC: 4.37 MIL/uL (ref 3.80–5.20)
RDW: 15.5 % — AB (ref 11.5–14.5)
WBC: 6.5 10*3/uL (ref 3.6–11.0)

## 2016-11-17 LAB — COMPREHENSIVE METABOLIC PANEL
ALBUMIN: 3.5 g/dL (ref 3.5–5.0)
ALT: 21 U/L (ref 14–54)
AST: 27 U/L (ref 15–41)
Alkaline Phosphatase: 74 U/L (ref 38–126)
Anion gap: 11 (ref 5–15)
BUN: 27 mg/dL — AB (ref 6–20)
CHLORIDE: 100 mmol/L — AB (ref 101–111)
CO2: 25 mmol/L (ref 22–32)
CREATININE: 1.39 mg/dL — AB (ref 0.44–1.00)
Calcium: 8.9 mg/dL (ref 8.9–10.3)
GFR calc Af Amer: 41 mL/min — ABNORMAL LOW (ref >60)
GFR calc non Af Amer: 36 mL/min — ABNORMAL LOW (ref >60)
GLUCOSE: 239 mg/dL — AB (ref 65–99)
POTASSIUM: 3.6 mmol/L (ref 3.5–5.1)
Sodium: 136 mmol/L (ref 135–145)
Total Bilirubin: 0.6 mg/dL (ref 0.3–1.2)
Total Protein: 6.6 g/dL (ref 6.5–8.1)

## 2016-11-17 LAB — GLUCOSE, CAPILLARY: GLUCOSE-CAPILLARY: 209 mg/dL — AB (ref 65–99)

## 2016-11-17 LAB — TYPE AND SCREEN
ABO/RH(D): O POS
Antibody Screen: NEGATIVE

## 2016-11-17 MED ORDER — POLYETHYLENE GLYCOL 3350 17 G PO PACK: 17.0000 g | PACK | Freq: Every day | ORAL | Status: DC | PRN

## 2016-11-17 MED ORDER — ACETAMINOPHEN 325 MG PO TABS
650.0000 mg | ORAL_TABLET | Freq: Four times a day (QID) | ORAL | Status: DC | PRN
  Administered 2016-11-18: 650 mg via ORAL
  Filled 2016-11-17: qty 2

## 2016-11-17 MED ORDER — INSULIN ASPART 100 UNIT/ML ~~LOC~~ SOLN: 0.0000 [IU] | Freq: Three times a day (TID) | SUBCUTANEOUS | Status: DC

## 2016-11-17 MED ORDER — INSULIN DEGLUDEC 100 UNIT/ML ~~LOC~~ SOPN: 15.0000 [IU] | PEN_INJECTOR | Freq: Every day | SUBCUTANEOUS | Status: DC

## 2016-11-17 MED ORDER — IRBESARTAN 150 MG PO TABS
150.0000 mg | ORAL_TABLET | Freq: Every day | ORAL | Status: DC
  Administered 2016-11-18 – 2016-11-19 (×3): 150 mg via ORAL
  Filled 2016-11-17 (×3): qty 1

## 2016-11-17 MED ORDER — ONDANSETRON HCL 4 MG/2ML IJ SOLN: 4.0000 mg | Freq: Four times a day (QID) | INTRAMUSCULAR | Status: DC | PRN

## 2016-11-17 MED ORDER — ACETAMINOPHEN 650 MG RE SUPP: 650.0000 mg | Freq: Four times a day (QID) | RECTAL | Status: DC | PRN

## 2016-11-17 MED ORDER — SODIUM CHLORIDE 0.9 % IV SOLN: Freq: Once | INTRAVENOUS | Status: DC

## 2016-11-17 MED ORDER — TRAMADOL HCL 50 MG PO TABS
50.0000 mg | ORAL_TABLET | Freq: Four times a day (QID) | ORAL | Status: DC | PRN
  Administered 2016-11-18 (×2): 50 mg via ORAL
  Filled 2016-11-17 (×2): qty 1

## 2016-11-17 MED ORDER — ONDANSETRON HCL 4 MG PO TABS
4.0000 mg | ORAL_TABLET | Freq: Four times a day (QID) | ORAL | Status: DC | PRN
Start: 2016-11-17 — End: 2016-11-19

## 2016-11-17 MED ORDER — OMEGA-3-ACID ETHYL ESTERS 1 G PO CAPS
2.0000 g | ORAL_CAPSULE | Freq: Two times a day (BID) | ORAL | Status: DC
  Administered 2016-11-17 – 2016-11-19 (×4): 2 g via ORAL
  Filled 2016-11-17 (×4): qty 2

## 2016-11-17 MED ORDER — ISOSORBIDE DINITRATE 20 MG PO TABS
45.0000 mg | ORAL_TABLET | Freq: Two times a day (BID) | ORAL | Status: DC
  Administered 2016-11-18 – 2016-11-19 (×4): 45 mg via ORAL
  Filled 2016-11-17 (×5): qty 0.5

## 2016-11-17 MED ORDER — INSULIN GLARGINE 100 UNIT/ML ~~LOC~~ SOLN
15.0000 [IU] | Freq: Every day | SUBCUTANEOUS | Status: DC
  Administered 2016-11-18 (×2): 15 [IU] via SUBCUTANEOUS
  Filled 2016-11-17 (×3): qty 0.15

## 2016-11-17 MED ORDER — INSULIN ASPART 100 UNIT/ML ~~LOC~~ SOLN
0.0000 [IU] | Freq: Three times a day (TID) | SUBCUTANEOUS | Status: DC
  Administered 2016-11-18: 2 [IU] via SUBCUTANEOUS
  Administered 2016-11-18: 7 [IU] via SUBCUTANEOUS
  Administered 2016-11-18: 1 [IU] via SUBCUTANEOUS
  Administered 2016-11-18: 5 [IU] via SUBCUTANEOUS
  Administered 2016-11-18 – 2016-11-19 (×2): 3 [IU] via SUBCUTANEOUS
  Administered 2016-11-19: 2 [IU] via SUBCUTANEOUS
  Filled 2016-11-17 (×7): qty 1

## 2016-11-17 MED ORDER — HYDRALAZINE HCL 25 MG PO TABS
25.0000 mg | ORAL_TABLET | Freq: Two times a day (BID) | ORAL | Status: DC
  Administered 2016-11-17 – 2016-11-19 (×4): 25 mg via ORAL
  Filled 2016-11-17 (×4): qty 1

## 2016-11-17 MED ORDER — SERTRALINE HCL 50 MG PO TABS
50.0000 mg | ORAL_TABLET | Freq: Every day | ORAL | Status: DC
  Administered 2016-11-18 – 2016-11-19 (×2): 50 mg via ORAL
  Filled 2016-11-17 (×2): qty 1

## 2016-11-17 NOTE — ED Triage Notes (Signed)
Pt to ED via POV c/o rectal pain, abdominal pain, and rectal bleeding that started this morning. Pt states that she does has a hx/o hemorrhoids and anal rupture. Pt states that she started noticed blood in her stool this morning. Pt states that stool is brown with bright red blood in the stool. Pt denies nausea or vomiting.

## 2016-11-17 NOTE — ED Notes (Signed)
Pt up to toilet. Pt able to ambulate with one stand by assist.

## 2016-11-17 NOTE — ED Notes (Signed)
PT reported feeling worried about blood glucose and requesting food. MD made aware and reported pt could have juice and crackers. Both provided.

## 2016-11-17 NOTE — ED Notes (Signed)
Pt reports having had an annal fissure in the past but denies this pain feeling similar to that. PT reports she has not had a BM since the movement this morning that had blood present.

## 2016-11-17 NOTE — ED Provider Notes (Signed)
[facility] Emergency Department Provider Note    First MD Initiated Contact with Patient 11/17/16 1841     (approximate)  I have reviewed the triage vital signs and the nursing notes.   HISTORY  Chief Complaint Rectal Bleeding    HPI Tanya Montgomery is a 76 y.o. female with episode of prior blood per rectum where she filled total today. Patient is on L [place] for history of CVA. Does endorse weakness. Denies any pain at this time but has been having intermittent epigastric pain and does have a history of reflux.Patient also has a history of hemorrhoids and anal fissure.   Past Medical History:  Diagnosis Date  . Asthma   . Breast cancer ([place]) 2000   . CHF (congestive heart failure) ([place])   . COPD (chronic obstructive pulmonary disease) ([place])   . Diabetes insipidus ([place])   . Diabetes mellitus without complication ([place])   . GERD (gastroesophageal reflux disease)   . Heart attack ([place])   . Heart murmur   . High cholesterol   . HTN (hypertension)   . Kidney disease   . Sleep apnea   . Stroke [facility])    TIA  . Thyroid nodule 2014   Family History  Problem Relation Age of Onset  . Emphysema Father   . Lung cancer Father   . Cancer Father        Thyroid  . Heart disease Mother        died age 21  . Brain cancer Son        died age 77   Past Surgical History:  Procedure Laterality Date  . APPENDECTOMY    . BREAST SURGERY Left 2001  . CAROTID STENT    . CHOLECYSTECTOMY     2004  . CORONARY ANGIOPLASTY  1998  . MASTECTOMY Left 2001  . NISSEN FUNDOPLICATION N/A 3825  . PARATHYROIDECTOMY Right 2002   Right inferior gland  . TONSILLECTOMY AND ADENOIDECTOMY    . TOTAL ABDOMINAL HYSTERECTOMY     Patient Active Problem List   Diagnosis Date Noted  . Acute CVA (cerebrovascular accident) ([place]) 01/11/2015  . CVA (cerebral infarction) 12/24/2014  . Solitary pulmonary nodule 03/16/2013  . Breast cancer ([place])   . Thyroid nodule   .  Diabetes mellitus without complication ([place])   . CHF (congestive heart failure) ([place])   . Asthma   . Hypertension 03/20/2012  . Chronic cough 12/15/2010      Prior to Admission medications   Medication Sig Start Date End Date Taking? Authorizing Provider  apixaban (ELIQUIS) 5 MG TABS tablet Take 1 tablet (5 mg total) by mouth 2 (two) times daily. 12/30/14   Bettey Costa, MD  atenolol (TENORMIN) 25 MG tablet Take 25 mg by mouth 2 (two) times daily.    [provider]  atorvastatin (LIPITOR) 40 MG tablet Take 1 tablet (40 mg total) by mouth daily at 6 PM. Patient taking differently: Take 40 mg by mouth daily.  12/25/14   Bettey Costa, MD  BIOTIN PO Take 1 capsule by mouth daily.    [provider]  digoxin (LANOXIN) 0.25 MG tablet Take 0.125-0.25 mg by mouth daily. Pt takes 0.125mg  on Monday, Wednesday, and Friday.   Pt takes 0.25mg  on Tuesday, Thursday, Saturday, and Sunday.    [provider]  esomeprazole (NEXIUM) 40 MG capsule Take 40 mg by mouth daily.     [provider]  glipiZIDE (GLUCOTROL XL) 10 MG 24 hr  tablet Take 10 mg by mouth daily.     [provider]  hydrALAZINE (APRESOLINE) 25 MG tablet Take 25 mg by mouth 2 (two) times daily.    [provider]  Insulin Degludec (TRESIBA FLEXTOUCH) 100 UNIT/ML SOPN Inject 40 Units into the skin at bedtime.    [provider]  isosorbide dinitrate (ISORDIL) 30 MG tablet Take 45 mg by mouth 2 (two) times daily.     [provider]  Multiple Vitamins-Minerals (MULTIVITAMIN WITH MINERALS) tablet Take 1 tablet by mouth daily.      [provider]  naproxen sodium (ANAPROX) 220 MG tablet Take 220 mg by mouth as needed.    [provider]  omega-3 acid ethyl esters (LOVAZA) 1 G capsule Take 2 g by mouth 2 (two) times daily.    [provider]  potassium chloride (KLOR-CON) 10 MEQ CR tablet Take 10 mEq by mouth daily. Pt takes on mon. Jeris Penta. fri.     [provider]  sertraline (ZOLOFT) 50 MG tablet Take 50 mg by mouth daily.    [provider]  sitaGLIPtin-metformin (JANUMET) 50-1000 MG tablet Take 1 tablet by mouth daily.    [provider]  spironolactone (ALDACTONE) 25 MG tablet Take 25 mg by mouth daily.    [provider]  torsemide (DEMADEX) 20 MG tablet Take 40 mg by mouth daily.    [provider]  traMADol (ULTRAM) 50 MG tablet Take 50 mg by mouth daily as needed.    [provider]  valACYclovir (VALTREX) 1000 MG tablet Take 1,000 mg by mouth daily.    [provider]  valsartan (DIOVAN) 160 MG tablet Take 160 mg by mouth daily.      [provider]    Allergies Levaquin [levofloxacin in d5w]    Social History Social History  Substance Use Topics  . Smoking status: Never Smoker  . Smokeless tobacco: Never Used  . Alcohol use No     Comment: Occasional drinker    Review of Systems Patient denies headaches, rhinorrhea, blurry vision, numbness, shortness of breath, chest pain, edema, cough, abdominal pain, nausea, vomiting, diarrhea, dysuria, fevers, rashes or hallucinations unless otherwise stated above in HPI. ____________________________________________   PHYSICAL EXAM:  VITAL SIGNS: Vitals:   11/17/16 1503 11/17/16 1843  BP: 132/65 (!) 186/74  Pulse: 68 (!) 54  Resp: 16 18  Temp: 98.1 F (36.7 C)     Constitutional: Alert and oriented. Well appearing and in no acute distress. Eyes: Conjunctivae are normal.  Head: Atraumatic. Nose: No congestion/rhinnorhea. Mouth/Throat: Mucous membranes are moist.   Neck: No stridor. Painless ROM.  Cardiovascular: Normal rate, regular rhythm. Grossly normal heart sounds.  Good peripheral circulation. Respiratory: Normal respiratory effort.  No retractions. Lungs CTAB. Gastrointestinal: Soft and nontender. No distention. No abdominal bruits. No CVA tenderness. Genitourinary: guaiac + stool with  BRPR, + hemorrhoids but no source of bleed identified Musculoskeletal: No lower extremity tenderness nor edema.  No joint effusions. Neurologic:  Normal speech and language. No gross focal neurologic deficits are appreciated. No facial droop Skin:  Skin is warm, dry and intact. No rash noted. Psychiatric: Mood and affect are normal. Speech and behavior are normal.  ____________________________________________   LABS (all labs ordered are listed, but only abnormal results are displayed)  Results for orders placed or performed during the hospital encounter of 11/17/16 (from the past 24 hour(s))  Comprehensive metabolic panel     Status: Abnormal   Collection Time:  11/17/16  3:01 PM  Result Value Ref Range   Sodium 136 135 - 145 mmol/L   Potassium 3.6 3.5 - 5.1 mmol/L   Chloride 100 (L) 101 - 111 mmol/L   CO2 25 22 - 32 mmol/L   Glucose, Bld 239 (H) 65 - 99 mg/dL   BUN 27 (H) 6 - 20 mg/dL   Creatinine, Ser 1.39 (H) 0.44 - 1.00 mg/dL   Calcium 8.9 8.9 - 10.3 mg/dL   Total Protein 6.6 6.5 - 8.1 g/dL   Albumin 3.5 3.5 - 5.0 g/dL   AST 27 15 - 41 U/L   ALT 21 14 - 54 U/L   Alkaline Phosphatase 74 38 - 126 U/L   Total Bilirubin 0.6 0.3 - 1.2 mg/dL   GFR calc non Af Amer 36 (L) >60 mL/min   GFR calc Af Amer 41 (L) >60 mL/min   Anion gap 11 5 - 15  CBC     Status: Abnormal   Collection Time: 11/17/16  3:01 PM  Result Value Ref Range   WBC 6.5 3.6 - 11.0 K/uL   RBC 4.37 3.80 - 5.20 MIL/uL   Hemoglobin 13.1 12.0 - 16.0 g/dL   HCT 37.6 35.0 - 47.0 %   MCV 86.0 80.0 - 100.0 fL   MCH 30.0 26.0 - 34.0 pg   MCHC 34.8 32.0 - 36.0 g/dL   RDW 15.5 (H) 11.5 - 14.5 %   Platelets 185 150 - 440 K/uL  Type and screen [facility]     Status: None   Collection Time: 11/17/16  3:01 PM  Result Value Ref Range   ABO/RH(D) O POS    Antibody Screen NEG    Sample Expiration 11/20/2016     ____________________________________________ ____________________________________________  RADIOLOGY   ____________________________________________   PROCEDURES  Procedure(s) performed:  Procedures    Critical Care performed: no ____________________________________________   INITIAL IMPRESSION / ASSESSMENT AND PLAN / ED COURSE  Pertinent labs & imaging results that were available during my care of the patient were reviewed by me and considered in my medical decision making (see chart for details).  DDX: gi bleed, diverticulosis, hemorrhois, anal fissure, upper GI  Tanya Montgomery is a 76 y.o. who presents to the ED with her blood per rectum. Patient is on L [place] with a history of congestive heart failure and does have evidence of gross blood on rectal exam. She is hemodynamically stable but does endorse weakness.  Based on her risk factors including anticoagulation with large volume bright blood per rectum do feel patient will benefit from admission to hospital for serial hemoglobins and hemodynamic monitoring.  Have discussed with the patient and available family all diagnostics and treatments performed thus far and all questions were answered to the best of my ability. The patient demonstrates understanding and agreement with plan.       ____________________________________________   FINAL CLINICAL IMPRESSION(S) / ED DIAGNOSES  Final diagnoses:  Bright red blood per rectum  Anticoagulated      NEW MEDICATIONS STARTED DURING THIS VISIT:  New Prescriptions   No medications on file     Note:  This document was prepared using Dragon voice recognition software and may include unintentional dictation errors.    Merlyn Lot, MD 11/17/16 2127

## 2016-11-17 NOTE — H&P (Signed)
[place] [facility] [place] NAME: Tanya Montgomery    MR#:  [id number]  DATE OF BIRTH:  05-Feb-1941  DATE OF ADMISSION:  11/17/2016  PRIMARY CARE PHYSICIAN: [place], [facility]   REQUESTING/REFERRING PHYSICIAN: Dr. Quentin Cornwall  CHIEF COMPLAINT:  Rectal bleed  HISTORY OF PRESENT ILLNESS:  Tanya Montgomery  is a 76 y.o. female with a known history of Internal hemorrhoids, and also fissure, history of COPD, diabetes and strokes in the past on chronic anticoagulation comes to the emergency room after she saw some blood in mixed with her stools this afternoon. She had another stress episode which was relatively small came to the emergency room and is being admitted for rectal bleed. Rectal exam per Dr. Quentin Cornwall did show some bright red blood however it was small amount. Patient's hemoglobin is 13.6. She denies any cramping abdominal pain nausea or vomiting. She is able to tolerate crackers and juice. No fever.  According to the daughter patient has every now and then some bleeding secondary to her hemorrhoids however at this time it was more than her usual and hence decided to come to the emergency room.  Her last colonoscopy was 5 years ago.  PAST MEDICAL HISTORY:   Past Medical History:  Diagnosis Date  . Asthma   . Breast cancer ([place]) 2000   . CHF (congestive heart failure) ([place])   . COPD (chronic obstructive pulmonary disease) ([place])   . Diabetes insipidus ([place])   . Diabetes mellitus without complication ([place])   . GERD (gastroesophageal reflux disease)   . Heart attack ([place])   . Heart murmur   . High cholesterol   . HTN (hypertension)   . Kidney disease   . Sleep apnea   . Stroke [facility])    TIA  . Thyroid nodule 2014    PAST SURGICAL HISTOIRY:   Past Surgical History:  Procedure Laterality Date  . APPENDECTOMY    . BREAST SURGERY Left 2001  . CAROTID STENT    . CHOLECYSTECTOMY     2004  . CORONARY ANGIOPLASTY  1998  .  MASTECTOMY Left 2001  . NISSEN FUNDOPLICATION N/A 4034  . PARATHYROIDECTOMY Right 2002   Right inferior gland  . TONSILLECTOMY AND ADENOIDECTOMY    . TOTAL ABDOMINAL HYSTERECTOMY      SOCIAL HISTORY:   Social History  Substance Use Topics  . Smoking status: Never Smoker  . Smokeless tobacco: Never Used  . Alcohol use No     Comment: Occasional drinker    FAMILY HISTORY:   Family History  Problem Relation Age of Onset  . Emphysema Father   . Lung cancer Father   . Cancer Father        Thyroid  . Heart disease Mother        died age 62  . Brain cancer Son        died age 79    DRUG ALLERGIES:   Allergies  Allergen Reactions  . Levaquin [Levofloxacin In D5w] Other (See Comments)    Reaction:  Tachycardia    REVIEW OF SYSTEMS:  Review of Systems  Constitutional: Negative for chills, fever and weight loss.  HENT: Negative for ear discharge, ear pain and nosebleeds.   Eyes: Negative for blurred vision, pain and discharge.  Respiratory: Negative for sputum production, shortness of breath, wheezing and stridor.   Cardiovascular: Negative for chest pain, palpitations, orthopnea and PND.  Gastrointestinal: Positive for blood in stool. Negative for abdominal  pain, diarrhea, nausea and vomiting.  Genitourinary: Negative for frequency and urgency.  Musculoskeletal: Negative for back pain and joint pain.  Neurological: Positive for weakness. Negative for sensory change, speech change and focal weakness.  Psychiatric/Behavioral: Negative for depression and hallucinations. The patient is not nervous/anxious.      MEDICATIONS AT HOME:   Prior to Admission medications   Medication Sig Start Date End Date Taking? Authorizing Provider  apixaban (ELIQUIS) 5 MG TABS tablet Take 1 tablet (5 mg total) by mouth 2 (two) times daily. 12/30/14   Bettey Costa, MD  atenolol (TENORMIN) 25 MG tablet Take 25 mg by mouth 2 (two) times daily.    [provider]  atorvastatin (LIPITOR)  40 MG tablet Take 1 tablet (40 mg total) by mouth daily at 6 PM. Patient taking differently: Take 40 mg by mouth daily.  12/25/14   Bettey Costa, MD  BIOTIN PO Take 1 capsule by mouth daily.    [provider]  digoxin (LANOXIN) 0.25 MG tablet Take 0.125-0.25 mg by mouth daily. Pt takes 0.125mg  on Monday, Wednesday, and Friday.   Pt takes 0.25mg  on Tuesday, Thursday, Saturday, and Sunday.    [provider]  esomeprazole (NEXIUM) 40 MG capsule Take 40 mg by mouth daily.     [provider]  glipiZIDE (GLUCOTROL XL) 10 MG 24 hr tablet Take 10 mg by mouth daily.     [provider]  hydrALAZINE (APRESOLINE) 25 MG tablet Take 25 mg by mouth 2 (two) times daily.    [provider]  Insulin Degludec (TRESIBA FLEXTOUCH) 100 UNIT/ML SOPN Inject 40 Units into the skin at bedtime.    [provider]  isosorbide dinitrate (ISORDIL) 30 MG tablet Take 45 mg by mouth 2 (two) times daily.     [provider]  Multiple Vitamins-Minerals (MULTIVITAMIN WITH MINERALS) tablet Take 1 tablet by mouth daily.      [provider]  naproxen sodium (ANAPROX) 220 MG tablet Take 220 mg by mouth as needed.    [provider]  omega-3 acid ethyl esters (LOVAZA) 1 G capsule Take 2 g by mouth 2 (two) times daily.    [provider]  potassium chloride (KLOR-CON) 10 MEQ CR tablet Take 10 mEq by mouth daily. Pt takes on mon. Tanya Montgomery. fri.    [provider]  sertraline (ZOLOFT) 50 MG tablet Take 50 mg by mouth daily.    [provider]  sitaGLIPtin-metformin (JANUMET) 50-1000 MG tablet Take 1 tablet by mouth daily.    [provider]  spironolactone (ALDACTONE) 25 MG tablet Take 25 mg by mouth daily.    [provider]  torsemide (DEMADEX) 20 MG tablet Take 40 mg by mouth daily.    [provider]  traMADol (ULTRAM) 50 MG tablet Take 50 mg by mouth daily as needed.    [provider]   valACYclovir (VALTREX) 1000 MG tablet Take 1,000 mg by mouth daily.    [provider]  valsartan (DIOVAN) 160 MG tablet Take 160 mg by mouth daily.      [provider]      VITAL SIGNS:  Blood pressure (!) 186/74, pulse (!) 54, temperature 98.1 F (36.7 C), temperature source Oral, resp. rate 18, SpO2 98 %.  PHYSICAL EXAMINATION:  GENERAL:  76 y.o.-year-old patient lying in the bed with no acute distress.  EYES: Pupils equal, round, reactive to light and accommodation. No scleral icterus. Extraocular muscles intact.  HEENT: Head atraumatic,  normocephalic. Oropharynx and nasopharynx clear.  NECK:  Supple, no jugular venous distention. No thyroid enlargement, no tenderness.  LUNGS: Normal breath sounds bilaterally, no wheezing, rales,rhonchi or crepitation. No use of accessory muscles of respiration.  CARDIOVASCULAR: S1, S2 normal. No murmurs, rubs, or gallops.  ABDOMEN: Soft, nontender, nondistended. Bowel sounds present. No organomegaly or mass.  EXTREMITIES: No pedal edema, cyanosis, or clubbing.  NEUROLOGIC: Cranial nerves II through XII are intact. Muscle strength 5/5 in all extremities. Sensation intact. Gait not checked.  PSYCHIATRIC:  patient is alert and oriented x 3.  SKIN: No obvious rash, lesion, or ulcer.   LABORATORY PANEL:   CBC  Recent Labs Lab 11/17/16 1501  WBC 6.5  HGB 13.1  HCT 37.6  PLT 185   ------------------------------------------------------------------------------------------------------------------  Chemistries   Recent Labs Lab 11/17/16 1501  NA 136  K 3.6  CL 100*  CO2 25  GLUCOSE 239*  BUN 27*  CREATININE 1.39*  CALCIUM 8.9  AST 27  ALT 21  ALKPHOS 74  BILITOT 0.6   ------------------------------------------------------------------------------------------------------------------  Cardiac Enzymes No results for input(s): TROPONINI in the last 168  hours. ------------------------------------------------------------------------------------------------------------------  RADIOLOGY:  No results found.  EKG:    IMPRESSION AND PLAN:   Tanya Montgomery  is a 76 y.o. female with a known history of Internal hemorrhoids, and also fissure, history of COPD, diabetes and strokes in the past on chronic anticoagulation comes to the emergency room after she saw some blood in mixed with her stools this afternoon. She had another stress episode which was relatively small came to the emergency room and is being admitted for rectal bleed.  1. Lower GI bleed/bright red blood per rectum appears hemorrhoidal bleed given history of hemorrhoidal bleed in the past -Admit to medical floor -H&H every 12 -Transfuse as needed -Patient currently hemodynamically stable -GI consultation if continues to bleed  2. History of multiple strokes on chronic anticoagulation -We'll hold off blood thinners until GI bleed resolves   3. Hypertension -Resume home meds  4. Type 2 diabetes -Sliding-scale insulin -If bleeding resolves advance diet and start home dose insulin.  5. DVT prophylaxis SCDs  CODE STATUS DO NOT RESUSCITATE this was discussed with patient and daughter in the ER   All the records are reviewed and case discussed with ED provider. Management plans discussed with the patient, family and they are in agreement.  CODE STATUS: DNR  TOTAL TIME TAKING CARE OF THIS PATIENT:50 minutes.    Jaishawn Witzke M.D on 11/17/2016 at 9:11 PM  Between 7am to 6pm - Pager - [phone]  After 6pm go to [email or web address] - password [email or web address] [facility]  [facility]  Office  [phone]  CC: Primary care physician; [facility]

## 2016-11-18 DIAGNOSIS — K922 Gastrointestinal hemorrhage, unspecified: Secondary | ICD-10-CM | POA: Diagnosis not present

## 2016-11-18 LAB — CBC
HCT: 36.1 % (ref 35.0–47.0)
Hemoglobin: 12.5 g/dL (ref 12.0–16.0)
MCH: 29.7 pg (ref 26.0–34.0)
MCHC: 34.5 g/dL (ref 32.0–36.0)
MCV: 86 fL (ref 80.0–100.0)
Platelets: 162 10*3/uL (ref 150–440)
RBC: 4.2 MIL/uL (ref 3.80–5.20)
RDW: 15.3 % — ABNORMAL HIGH (ref 11.5–14.5)
WBC: 6.3 10*3/uL (ref 3.6–11.0)

## 2016-11-18 LAB — GLUCOSE, CAPILLARY
GLUCOSE-CAPILLARY: 122 mg/dL — AB (ref 65–99)
GLUCOSE-CAPILLARY: 168 mg/dL — AB (ref 65–99)
Glucose-Capillary: 150 mg/dL — ABNORMAL HIGH (ref 65–99)
Glucose-Capillary: 260 mg/dL — ABNORMAL HIGH (ref 65–99)
Glucose-Capillary: 307 mg/dL — ABNORMAL HIGH (ref 65–99)

## 2016-11-18 NOTE — Care Management Obs Status (Signed)
[place] NOTIFICATION   Patient Details  Name: JASMEN EMRICH MRN: [id number] Date of Birth: 06-10-1940   Medicare Observation Status Notification Given:  Yes [facility] letter)    Mardene Speak, RN 11/18/2016, 5:59 PM

## 2016-11-18 NOTE — Progress Notes (Signed)
[place] [facility] [place] NAME: Tanya Montgomery    MR#:  [id number]  DATE OF BIRTH:  1941/03/05  SUBJECTIVE: Admitted for rectal bleed. Patient has history of hemorrhoids, noted to have some blood mixed with stools, admitted for the same. Did not have dizziness or abdominal pain. Hemodynamically stable. No further rectal pain since admission. Hemoglobin this morning 12.5. On admission it was 13.1.   CHIEF COMPLAINT:   Chief Complaint  Patient presents with  . Rectal Bleeding    REVIEW OF SYSTEMS:   ROS CONSTITUTIONAL: No fever, fatigue or weakness.  EYES: No blurred or Montgomery vision.  EARS, NOSE, AND THROAT: No tinnitus or ear pain.  RESPIRATORY: No cough, shortness of breath, wheezing or hemoptysis.  CARDIOVASCULAR: No chest pain, orthopnea, edema.  GASTROINTESTINAL: No nausea, vomiting, diarrhea or abdominal pain.  GENITOURINARY: No dysuria, hematuria.  ENDOCRINE: No polyuria, nocturia,  HEMATOLOGY: No anemia, easy bruising or bleeding SKIN: No rash or lesion. MUSCULOSKELETAL: No joint pain or arthritis.   NEUROLOGIC: No tingling, numbness, weakness.  PSYCHIATRY: No anxiety or depression.   DRUG ALLERGIES:   Allergies  Allergen Reactions  . Levaquin [Levofloxacin In D5w] Other (See Comments)    Reaction:  Tachycardia    VITALS:  Blood pressure (!) 160/61, pulse (!) 58, temperature 98.2 F (36.8 C), temperature source Oral, resp. rate 18, height 5' 2.5" (1.588 m), weight 61.3 kg (135 lb 3.2 oz), SpO2 95 %.  PHYSICAL EXAMINATION:  GENERAL:  76 y.o.-year-old patient lying in the bed with no acute distress.  EYES: Pupils equal, round, reactive to light and accommodation. No scleral icterus. Extraocular muscles intact.  HEENT: Head atraumatic, normocephalic. Oropharynx and nasopharynx clear.  NECK:  Supple, no jugular venous distention. No thyroid enlargement, no tenderness.  LUNGS: Normal breath sounds bilaterally, no  wheezing, rales,rhonchi or crepitation. No use of accessory muscles of respiration.  CARDIOVASCULAR: S1, S2 normal. No murmurs, rubs, or gallops.  ABDOMEN: Soft, nontender, nondistended. Bowel sounds present. No organomegaly or mass.  EXTREMITIES: No pedal edema, cyanosis, or clubbing.  NEUROLOGIC: Cranial nerves II through XII are intact. Muscle strength 5/5 in all extremities. Sensation intact. Gait not checked.  PSYCHIATRIC: The patient is alert and oriented x 3.  SKIN: No obvious rash, lesion, or ulcer.    LABORATORY PANEL:   CBC  Recent Labs Lab 11/18/16 0355  WBC 6.3  HGB 12.5  HCT 36.1  PLT 162   ------------------------------------------------------------------------------------------------------------------  Chemistries   Recent Labs Lab 11/17/16 1501  NA 136  K 3.6  CL 100*  CO2 25  GLUCOSE 239*  BUN 27*  CREATININE 1.39*  CALCIUM 8.9  AST 27  ALT 21  ALKPHOS 74  BILITOT 0.6   ------------------------------------------------------------------------------------------------------------------  Cardiac Enzymes No results for input(s): TROPONINI in the last 168 hours. ------------------------------------------------------------------------------------------------------------------  RADIOLOGY:  No results found.  EKG:   Orders placed or performed during the hospital encounter of 08/31/15  . ED EKG  . ED EKG  . EKG 12-Lead  . EKG 12-Lead    ASSESSMENT AND PLAN:   Rectal bleed ;likely secondary to hemorrhoids: Hemodynamically stable. Start the patient on low-sodium carb control diet, watch today for any further signs of bleeding and drop in hemoglobin, possible discharge tomorrow if  stable.  #2 diabetes mellitus type 2: Continue Lantus, SSI with coverage #3 history of essential hypertension: Controlled #4 history of COPD: No wheezing. #5 history of  CAD: Continue home medication with aspirin, beta blockers, statins.  6 .history of multiple  strokes on chronic anticoagulation: Resume apixiban  From tomorrow     All the records are reviewed and case discussed with Care Management/Social Workerr. Management plans discussed with the patient, family and they are in agreement.  CODE STATUS: full  TOTAL TIME TAKING CARE OF THIS PATIENT: 35 minutes.   POSSIBLE D/C IN 1-2DAYS, DEPENDING ON CLINICAL CONDITION.   Epifanio Lesches M.D on 11/18/2016 at 11:00 AM  Between 7am to 6pm - Pager - [phone]  After 6pm go to [email or web address] - password [email or web address] [facility]  Office  [phone]  CC: Primary care physician; [facility]   Note: This dictation was prepared with Dragon dictation along with smaller phrase technology. Any transcriptional errors that result from this process are unintentional.

## 2016-11-19 ENCOUNTER — Ambulatory Visit: Payer: Medicare HMO | Admitting: Speech Pathology

## 2016-11-19 DIAGNOSIS — K922 Gastrointestinal hemorrhage, unspecified: Secondary | ICD-10-CM | POA: Diagnosis not present

## 2016-11-19 LAB — GLUCOSE, CAPILLARY
GLUCOSE-CAPILLARY: 144 mg/dL — AB (ref 65–99)
GLUCOSE-CAPILLARY: 190 mg/dL — AB (ref 65–99)
Glucose-Capillary: 224 mg/dL — ABNORMAL HIGH (ref 65–99)

## 2016-11-19 LAB — CBC
HCT: 35.8 % (ref 35.0–47.0)
Hemoglobin: 12.5 g/dL (ref 12.0–16.0)
MCH: 30.1 pg (ref 26.0–34.0)
MCHC: 34.9 g/dL (ref 32.0–36.0)
MCV: 86.3 fL (ref 80.0–100.0)
PLATELETS: 144 10*3/uL — AB (ref 150–440)
RBC: 4.14 MIL/uL (ref 3.80–5.20)
RDW: 15.6 % — ABNORMAL HIGH (ref 11.5–14.5)
WBC: 7.2 10*3/uL (ref 3.6–11.0)

## 2016-11-19 NOTE — Progress Notes (Signed)
Discharge instructions reviewed with the patient.  Patient sent out via wheelchair

## 2016-11-19 NOTE — Progress Notes (Signed)
Inpatient Diabetes Program Recommendations  AACE/ADA: New Consensus Statement on Inpatient Glycemic Control (2015)  Target Ranges:  Prepandial:   less than 140 mg/dL      Peak postprandial:   less than 180 mg/dL (1-2 hours)      Critically ill patients:  140 - 180 mg/dL   Results for TEDDY, PENA (MRN [id number]) as of 11/19/2016 08:51  Ref. Range 11/18/2016 07:27 11/18/2016 12:18 11/18/2016 16:51 11/18/2016 20:08 11/19/2016 02:56 11/19/2016 07:55  Glucose-Capillary Latest Ref Range: 65 - 99 mg/dL 150 (H) 168 (H) 260 (H) 307 (H) 144 (H) 190 (H)   Review of Glycemic Control  Diabetes history: DM2 Outpatient Diabetes medications: Glipizide XL 10 mg daily, Janumet 50-1000 mg daily, Tresiba 40 units QHS Current orders for Inpatient glycemic control: Lantus 15 units QHs, Novolog 0-9 units ACHS  Inpatient Diabetes Program Recommendations: Insulin - Meal Coverage: Please consider ordering Novolog 3 units TID with meals for meal coverage if patient eats at least 50% of meals. A1C: Please consider ordering an A1C to evaluate glycemic control over the past 2-3 months.  Thanks, Barnie Alderman, RN, MSN, CDE Diabetes Coordinator Inpatient Diabetes Program [phone] (Team Pager from 8am to 5pm)

## 2016-11-19 NOTE — Discharge Summary (Signed)
[place] [facility] [place] NAME: Tanya Montgomery    MR#:  [id number]  DATE OF BIRTH:  Feb 18, 1941  DATE OF ADMISSION:  11/17/2016 ADMITTING PHYSICIAN: Fritzi Mandes, MD  DATE OF DISCHARGE: 11/19/2016  PRIMARY CARE PHYSICIAN: [place], [facility]    ADMISSION DIAGNOSIS:  Bright red blood per rectum [K62.5] Anticoagulated [Z79.01]  DISCHARGE DIAGNOSIS:  Active Problems:   Rectal bleed   SECONDARY DIAGNOSIS:   Past Medical History:  Diagnosis Date  . Asthma   . Breast cancer ([place]) 2000   . CHF (congestive heart failure) ([place])   . COPD (chronic obstructive pulmonary disease) ([place])   . Diabetes insipidus ([place])   . Diabetes mellitus without complication ([place])   . GERD (gastroesophageal reflux disease)   . Heart attack ([place])   . Heart murmur   . High cholesterol   . HTN (hypertension)   . Kidney disease   . Sleep apnea   . Stroke [facility])    TIA  . Thyroid nodule 2014    HOSPITAL COURSE:   76 year old female with past medical history of CHF, COPD, diabetes, GERD, previous history of MI, hypertension, hyperlipidemia, history of previous CVA, obstructive sleep apnea who presented to the hospital due to rectal bleeding.  1. GI bleed-patient presented to the hospital with episodes of rectal bleeding. -She was observed overnight in the hospital, her hemoglobins remained stable. This was likely hemorrhoidal in nature and it has resolved now. Patient is on Eliquis which was held but since her bleeding has stopped she can resume her Eliquis upon discharge.  2. History of previous CVA-patient is on Eliquis and she will continue that as her bleeding is resolved.  3. Diabetes type 2 without complication-continue her Tresiba insulin, Januvia, Metformin, Glipizide.  - BS Stable while in hospital.   4. Essential hypertension-patient will continue her atenolol, hydralazine, Imdur, losartan.  5. Hyperlipidemia-continue atorvastatin.  6.  GERD-continue Protonix.  7. Depression - pt. Will cont. Zoloft, Wellbutrin.   DISCHARGE CONDITIONS:   Stable.   CONSULTS OBTAINED:    DRUG ALLERGIES:   Allergies  Allergen Reactions  . Levaquin [Levofloxacin In D5w] Other (See Comments)    Reaction:  Tachycardia    DISCHARGE MEDICATIONS:   Allergies as of 11/19/2016      Reactions   Levaquin [levofloxacin In D5w] Other (See Comments)   Reaction:  Tachycardia      Medication List    TAKE these medications   apixaban 5 MG Tabs tablet Commonly known as:  ELIQUIS Take 1 tablet (5 mg total) by mouth 2 (two) times daily.   atenolol 50 MG tablet Commonly known as:  TENORMIN Take 50 mg by mouth daily.   atorvastatin 40 MG tablet Commonly known as:  LIPITOR Take 1 tablet (40 mg total) by mouth daily at 6 PM. What changed:  when to take this   BIOTIN PO Take 1 capsule by mouth daily.   buPROPion 150 MG 24 hr tablet Commonly known as:  WELLBUTRIN XL Take 150 mg by mouth 2 (two) times daily.   cetirizine 10 MG tablet Commonly known as:  ZYRTEC Take 10 mg by mouth daily.   cholecalciferol 1000 units tablet Commonly known as:  VITAMIN D Take 1,000 Units by mouth daily.   digoxin 0.25 MG tablet Commonly known as:  LANOXIN Take 0.125-0.25 mg by mouth daily. Pt takes 0.125mg  on Monday, Wednesday, and Friday.   Pt takes 0.25mg  on Tuesday, Thursday, Saturday, and Sunday.  donepezil 5 MG tablet Commonly known as:  ARICEPT Take 5 mg by mouth at bedtime.   DULoxetine 20 MG capsule Commonly known as:  CYMBALTA Take 20 mg by mouth daily.   glipiZIDE 10 MG 24 hr tablet Commonly known as:  GLUCOTROL XL Take 10 mg by mouth daily.   hydrALAZINE 25 MG tablet Commonly known as:  APRESOLINE Take 25 mg by mouth 3 (three) times daily.   isosorbide dinitrate 30 MG tablet Commonly known as:  ISORDIL Take 45 mg by mouth 2 (two) times daily.   multivitamin with minerals tablet Take 1 tablet by mouth daily.   omega-3  acid ethyl esters 1 g capsule Commonly known as:  LOVAZA Take 2 g by mouth 2 (two) times daily.   pantoprazole 40 MG tablet Commonly known as:  PROTONIX Take 40 mg by mouth daily.   potassium chloride 10 MEQ CR tablet Commonly known as:  KLOR-CON Take 10 mEq by mouth daily. Pt takes on mon. Jeris Penta. fri.   primidone 50 MG tablet Commonly known as:  MYSOLINE Take 50 mg by mouth 2 (two) times daily.   sertraline 50 MG tablet Commonly known as:  ZOLOFT Take 50 mg by mouth daily.   sitaGLIPtin-metformin 50-1000 MG tablet Commonly known as:  JANUMET Take 1 tablet by mouth daily.   torsemide 20 MG tablet Commonly known as:  DEMADEX Take 40 mg by mouth daily.   traMADol 50 MG tablet Commonly known as:  ULTRAM Take 50 mg by mouth daily as needed.   TRESIBA FLEXTOUCH 100 UNIT/ML Sopn FlexTouch Pen Generic drug:  insulin degludec Inject 40 Units into the skin at bedtime.   valACYclovir 1000 MG tablet Commonly known as:  VALTREX Take 1,000 mg by mouth daily.   valsartan 160 MG tablet Commonly known as:  DIOVAN Take 160 mg by mouth daily.         DISCHARGE INSTRUCTIONS:   DIET:  Cardiac diet and Diabetic diet  DISCHARGE CONDITION:  Stable  ACTIVITY:  Activity as tolerated  OXYGEN:  Home Oxygen: No.   Oxygen Delivery: room air  DISCHARGE LOCATION:  home   If you experience worsening of your admission symptoms, develop shortness of breath, life threatening emergency, suicidal or homicidal thoughts you must seek medical attention immediately by calling 911 or calling your MD immediately  if symptoms less severe.  You Must read complete instructions/literature along with all the possible adverse reactions/side effects for all the Medicines you take and that have been prescribed to you. Take any new Medicines after you have completely understood and accpet all the possible adverse reactions/side effects.   Please note  You were cared for by a hospitalist during  your hospital stay. If you have any questions about your discharge medications or the care you received while you were in the hospital after you are discharged, you can call the unit and asked to speak with the hospitalist on call if the hospitalist that took care of you is not available. Once you are discharged, your primary care physician will handle any further medical issues. Please note that NO REFILLS for any discharge medications will be authorized once you are discharged, as it is imperative that you return to your primary care physician (or establish a relationship with a primary care physician if you do not have one) for your aftercare needs so that they can reassess your need for medications and monitor your lab values.     Today   No acute bleeding  overnight. Hemoglobin stable. Wants to go home.  VITAL SIGNS:  Blood pressure 136/63, pulse 75, temperature 97.9 F (36.6 C), temperature source Oral, resp. rate 16, height 5' 2.5" (1.588 m), weight 61.3 kg (135 lb 3.2 oz), SpO2 93 %.  I/O:   Intake/Output Summary (Last 24 hours) at 11/19/16 1410 Last data filed at 11/19/16 0928  Gross per 24 hour  Intake              480 ml  Output              200 ml  Net              280 ml    PHYSICAL EXAMINATION:  GENERAL:  76 y.o.-year-old patient lying in the bed with no acute distress.  EYES: Pupils equal, round, reactive to light and accommodation. No scleral icterus. Extraocular muscles intact.  HEENT: Head atraumatic, normocephalic. Oropharynx and nasopharynx clear.  NECK:  Supple, no jugular venous distention. No thyroid enlargement, no tenderness.  LUNGS: Normal breath sounds bilaterally, no wheezing, rales,rhonchi. No use of accessory muscles of respiration.  CARDIOVASCULAR: S1, S2 normal. No murmurs, rubs, or gallops.  ABDOMEN: Soft, non-tender, non-distended. Bowel sounds present. No organomegaly or mass.  EXTREMITIES: No pedal edema, cyanosis, or clubbing.  NEUROLOGIC: Cranial  nerves II through XII are intact. No focal motor or sensory defecits b/l.  PSYCHIATRIC: The patient is alert and oriented x 3. Good affect.  SKIN: No obvious rash, lesion, or ulcer.   DATA REVIEW:   CBC  Recent Labs Lab 11/19/16 0457  WBC 7.2  HGB 12.5  HCT 35.8  PLT 144*    Chemistries   Recent Labs Lab 11/17/16 1501  NA 136  K 3.6  CL 100*  CO2 25  GLUCOSE 239*  BUN 27*  CREATININE 1.39*  CALCIUM 8.9  AST 27  ALT 21  ALKPHOS 74  BILITOT 0.6    Cardiac Enzymes No results for input(s): TROPONINI in the last 168 hours.    RADIOLOGY:  No results found.    Management plans discussed with the patient, family and they are in agreement.  CODE STATUS:     Code Status Orders        Start     Ordered   11/17/16 2112  Do not attempt resuscitation (DNR)  Continuous    Question Answer Comment  In the event of cardiac or respiratory ARREST Do not call a "code blue"   In the event of cardiac or respiratory ARREST Do not perform Intubation, CPR, defibrillation or ACLS   In the event of cardiac or respiratory ARREST Use medication by any route, position, wound care, and other measures to relive pain and suffering. May use oxygen, suction and manual treatment of airway obstruction as needed for comfort.      11/17/16 2111        Advance Directive Documentation     Most Recent Value  Type of Advance Directive  Healthcare Power of Attorney, Living will  Pre-existing out of facility DNR order (yellow form or pink MOST form)  -  "MOST" Form in Place?  -      TOTAL TIME TAKING CARE OF THIS PATIENT: 40 minutes.    Henreitta Leber M.D on 11/19/2016 at 2:10 PM  Between 7am to 6pm - Pager - [phone]  After 6pm go to [email or web address] - [employer] [facility]  Office  [phone]  CC: Primary care physician; [facility]

## 2016-11-21 ENCOUNTER — Encounter: Payer: Medicare Other | Admitting: Speech Pathology

## 2017-04-03 ENCOUNTER — Emergency Department: Payer: Medicare HMO

## 2017-04-03 ENCOUNTER — Other Ambulatory Visit: Payer: Self-pay

## 2017-04-03 ENCOUNTER — Encounter: Admission: EM | Disposition: E | Payer: Self-pay | Source: Home / Self Care | Attending: Internal Medicine

## 2017-04-03 ENCOUNTER — Encounter: Payer: Self-pay | Admitting: Emergency Medicine

## 2017-04-03 ENCOUNTER — Emergency Department: Admit: 2017-04-03 | Payer: Medicare HMO | Admitting: Internal Medicine

## 2017-04-03 ENCOUNTER — Inpatient Hospital Stay
Admission: EM | Admit: 2017-04-03 | Discharge: 2017-04-06 | DRG: 871 | Disposition: E | Payer: Medicare HMO | Attending: Internal Medicine | Admitting: Internal Medicine

## 2017-04-03 DIAGNOSIS — B952 Enterococcus as the cause of diseases classified elsewhere: Secondary | ICD-10-CM | POA: Diagnosis present

## 2017-04-03 DIAGNOSIS — I509 Heart failure, unspecified: Secondary | ICD-10-CM | POA: Diagnosis present

## 2017-04-03 DIAGNOSIS — Z9861 Coronary angioplasty status: Secondary | ICD-10-CM

## 2017-04-03 DIAGNOSIS — J690 Pneumonitis due to inhalation of food and vomit: Secondary | ICD-10-CM | POA: Diagnosis present

## 2017-04-03 DIAGNOSIS — I110 Hypertensive heart disease with heart failure: Secondary | ICD-10-CM | POA: Diagnosis present

## 2017-04-03 DIAGNOSIS — Z8249 Family history of ischemic heart disease and other diseases of the circulatory system: Secondary | ICD-10-CM

## 2017-04-03 DIAGNOSIS — R011 Cardiac murmur, unspecified: Secondary | ICD-10-CM | POA: Diagnosis present

## 2017-04-03 DIAGNOSIS — E119 Type 2 diabetes mellitus without complications: Secondary | ICD-10-CM | POA: Diagnosis present

## 2017-04-03 DIAGNOSIS — R0902 Hypoxemia: Secondary | ICD-10-CM

## 2017-04-03 DIAGNOSIS — I252 Old myocardial infarction: Secondary | ICD-10-CM

## 2017-04-03 DIAGNOSIS — J189 Pneumonia, unspecified organism: Secondary | ICD-10-CM | POA: Diagnosis not present

## 2017-04-03 DIAGNOSIS — Z9049 Acquired absence of other specified parts of digestive tract: Secondary | ICD-10-CM | POA: Diagnosis not present

## 2017-04-03 DIAGNOSIS — Z9012 Acquired absence of left breast and nipple: Secondary | ICD-10-CM

## 2017-04-03 DIAGNOSIS — Z66 Do not resuscitate: Secondary | ICD-10-CM | POA: Diagnosis present

## 2017-04-03 DIAGNOSIS — Z794 Long term (current) use of insulin: Secondary | ICD-10-CM | POA: Diagnosis not present

## 2017-04-03 DIAGNOSIS — G4730 Sleep apnea, unspecified: Secondary | ICD-10-CM | POA: Diagnosis present

## 2017-04-03 DIAGNOSIS — J449 Chronic obstructive pulmonary disease, unspecified: Secondary | ICD-10-CM | POA: Diagnosis present

## 2017-04-03 DIAGNOSIS — Z90710 Acquired absence of both cervix and uterus: Secondary | ICD-10-CM | POA: Diagnosis not present

## 2017-04-03 DIAGNOSIS — J9621 Acute and chronic respiratory failure with hypoxia: Secondary | ICD-10-CM

## 2017-04-03 DIAGNOSIS — N289 Disorder of kidney and ureter, unspecified: Secondary | ICD-10-CM | POA: Diagnosis present

## 2017-04-03 DIAGNOSIS — K219 Gastro-esophageal reflux disease without esophagitis: Secondary | ICD-10-CM | POA: Diagnosis present

## 2017-04-03 DIAGNOSIS — I213 ST elevation (STEMI) myocardial infarction of unspecified site: Secondary | ICD-10-CM

## 2017-04-03 DIAGNOSIS — Z7901 Long term (current) use of anticoagulants: Secondary | ICD-10-CM | POA: Diagnosis not present

## 2017-04-03 DIAGNOSIS — Z8673 Personal history of transient ischemic attack (TIA), and cerebral infarction without residual deficits: Secondary | ICD-10-CM | POA: Diagnosis not present

## 2017-04-03 DIAGNOSIS — R6521 Severe sepsis with septic shock: Secondary | ICD-10-CM | POA: Diagnosis present

## 2017-04-03 DIAGNOSIS — A419 Sepsis, unspecified organism: Secondary | ICD-10-CM | POA: Diagnosis present

## 2017-04-03 DIAGNOSIS — Z853 Personal history of malignant neoplasm of breast: Secondary | ICD-10-CM | POA: Diagnosis not present

## 2017-04-03 DIAGNOSIS — Z515 Encounter for palliative care: Secondary | ICD-10-CM | POA: Diagnosis present

## 2017-04-03 DIAGNOSIS — R1310 Dysphagia, unspecified: Secondary | ICD-10-CM | POA: Diagnosis present

## 2017-04-03 DIAGNOSIS — R0603 Acute respiratory distress: Secondary | ICD-10-CM | POA: Diagnosis present

## 2017-04-03 DIAGNOSIS — Z825 Family history of asthma and other chronic lower respiratory diseases: Secondary | ICD-10-CM

## 2017-04-03 DIAGNOSIS — J9620 Acute and chronic respiratory failure, unspecified whether with hypoxia or hypercapnia: Secondary | ICD-10-CM | POA: Diagnosis not present

## 2017-04-03 DIAGNOSIS — Z801 Family history of malignant neoplasm of trachea, bronchus and lung: Secondary | ICD-10-CM

## 2017-04-03 DIAGNOSIS — N390 Urinary tract infection, site not specified: Secondary | ICD-10-CM | POA: Diagnosis present

## 2017-04-03 DIAGNOSIS — Z808 Family history of malignant neoplasm of other organs or systems: Secondary | ICD-10-CM

## 2017-04-03 DIAGNOSIS — Z881 Allergy status to other antibiotic agents status: Secondary | ICD-10-CM

## 2017-04-03 LAB — CBC WITH DIFFERENTIAL/PLATELET
BASOS PCT: 1 %
Basophils Absolute: 0.2 10*3/uL — ABNORMAL HIGH (ref 0–0.1)
EOS PCT: 0 %
Eosinophils Absolute: 0 10*3/uL (ref 0–0.7)
HEMATOCRIT: 37.5 % (ref 35.0–47.0)
Hemoglobin: 12.2 g/dL (ref 12.0–16.0)
Lymphocytes Relative: 9 %
Lymphs Abs: 1.2 10*3/uL (ref 1.0–3.6)
MCH: 31.1 pg (ref 26.0–34.0)
MCHC: 32.6 g/dL (ref 32.0–36.0)
MCV: 95.3 fL (ref 80.0–100.0)
MONO ABS: 1.4 10*3/uL — AB (ref 0.2–0.9)
MONOS PCT: 10 %
NEUTROS ABS: 11 10*3/uL — AB (ref 1.4–6.5)
Neutrophils Relative %: 80 %
PLATELETS: 253 10*3/uL (ref 150–440)
RBC: 3.93 MIL/uL (ref 3.80–5.20)
RDW: 14.9 % — AB (ref 11.5–14.5)
WBC: 13.8 10*3/uL — ABNORMAL HIGH (ref 3.6–11.0)

## 2017-04-03 LAB — COMPREHENSIVE METABOLIC PANEL
ALK PHOS: 118 U/L (ref 38–126)
ALT: 53 U/L (ref 14–54)
AST: 46 U/L — AB (ref 15–41)
Albumin: 2.5 g/dL — ABNORMAL LOW (ref 3.5–5.0)
Anion gap: 13 (ref 5–15)
BILIRUBIN TOTAL: 0.7 mg/dL (ref 0.3–1.2)
BUN: 35 mg/dL — AB (ref 6–20)
CALCIUM: 9.3 mg/dL (ref 8.9–10.3)
CHLORIDE: 107 mmol/L (ref 101–111)
CO2: 27 mmol/L (ref 22–32)
CREATININE: 0.97 mg/dL (ref 0.44–1.00)
GFR, EST NON AFRICAN AMERICAN: 55 mL/min — AB (ref >60)
Glucose, Bld: 417 mg/dL — ABNORMAL HIGH (ref 65–99)
Potassium: 4.4 mmol/L (ref 3.5–5.1)
Sodium: 147 mmol/L — ABNORMAL HIGH (ref 135–145)
TOTAL PROTEIN: 7.2 g/dL (ref 6.5–8.1)

## 2017-04-03 LAB — URINALYSIS, ROUTINE W REFLEX MICROSCOPIC
Bilirubin Urine: NEGATIVE
KETONES UR: NEGATIVE mg/dL
Nitrite: NEGATIVE
Protein, ur: 100 mg/dL — AB
Specific Gravity, Urine: 1.011 (ref 1.005–1.030)
pH: 5 (ref 5.0–8.0)

## 2017-04-03 LAB — TROPONIN I: Troponin I: 0.44 ng/mL (ref <0.03)

## 2017-04-03 LAB — HEPARIN LEVEL (UNFRACTIONATED): HEPARIN UNFRACTIONATED: 0.43 [IU]/mL (ref 0.30–0.70)

## 2017-04-03 LAB — LIPASE, BLOOD: LIPASE: 37 U/L (ref 11–51)

## 2017-04-03 LAB — PROCALCITONIN: Procalcitonin: 0.25 ng/mL

## 2017-04-03 LAB — LACTIC ACID, PLASMA: Lactic Acid, Venous: 2.3 mmol/L (ref 0.5–1.9)

## 2017-04-03 LAB — PROTIME-INR
INR: 1.13
PROTHROMBIN TIME: 14.4 s (ref 11.4–15.2)

## 2017-04-03 LAB — BRAIN NATRIURETIC PEPTIDE: B NATRIURETIC PEPTIDE 5: 3968 pg/mL — AB (ref 0.0–100.0)

## 2017-04-03 LAB — MRSA PCR SCREENING: MRSA by PCR: POSITIVE — AB

## 2017-04-03 LAB — APTT: aPTT: 30 seconds (ref 24–36)

## 2017-04-03 SURGERY — CORONARY/GRAFT ACUTE MI REVASCULARIZATION
Anesthesia: Moderate Sedation

## 2017-04-03 MED ORDER — ACETAMINOPHEN 650 MG RE SUPP: 650.0000 mg | Freq: Four times a day (QID) | RECTAL | Status: DC | PRN

## 2017-04-03 MED ORDER — ONDANSETRON 4 MG PO TBDP
4.0000 mg | ORAL_TABLET | Freq: Four times a day (QID) | ORAL | Status: DC | PRN
  Filled 2017-04-03: qty 1

## 2017-04-03 MED ORDER — GLYCOPYRROLATE 1 MG PO TABS: 1.0000 mg | ORAL_TABLET | ORAL | Status: DC | PRN

## 2017-04-03 MED ORDER — BIOTENE DRY MOUTH MT LIQD
15.0000 mL | OROMUCOSAL | Status: DC | PRN
  Filled 2017-04-03: qty 15

## 2017-04-03 MED ORDER — ASPIRIN 300 MG RE SUPP
300.0000 mg | Freq: Once | RECTAL | Status: AC
  Administered 2017-04-03: 300 mg via RECTAL
  Filled 2017-04-03: qty 1

## 2017-04-03 MED ORDER — HALOPERIDOL LACTATE 5 MG/ML IJ SOLN: 0.5000 mg | INTRAMUSCULAR | Status: DC | PRN

## 2017-04-03 MED ORDER — MORPHINE SULFATE (PF) 2 MG/ML IV SOLN: 1.0000 mg | INTRAVENOUS | Status: DC | PRN

## 2017-04-03 MED ORDER — ONDANSETRON HCL 4 MG/2ML IJ SOLN: 4.0000 mg | Freq: Four times a day (QID) | INTRAMUSCULAR | Status: DC | PRN

## 2017-04-03 MED ORDER — HEPARIN (PORCINE) IN NACL 100-0.45 UNIT/ML-% IJ SOLN
750.0000 [IU]/h | INTRAMUSCULAR | Status: DC
  Filled 2017-04-03: qty 250

## 2017-04-03 MED ORDER — MORPHINE SULFATE (PF) 2 MG/ML IV SOLN
1.0000 mg | INTRAVENOUS | Status: DC | PRN
  Administered 2017-04-03 (×6): 1 mg via INTRAVENOUS
  Filled 2017-04-03 (×6): qty 1

## 2017-04-03 MED ORDER — MUPIROCIN 2 % EX OINT
1.0000 "application " | TOPICAL_OINTMENT | Freq: Two times a day (BID) | CUTANEOUS | Status: DC
  Administered 2017-04-03: 1 via NASAL
  Filled 2017-04-03: qty 22

## 2017-04-03 MED ORDER — LORAZEPAM 1 MG PO TABS: 1.0000 mg | ORAL_TABLET | ORAL | Status: DC | PRN

## 2017-04-03 MED ORDER — HALOPERIDOL 0.5 MG PO TABS
0.5000 mg | ORAL_TABLET | ORAL | Status: DC | PRN
  Filled 2017-04-03: qty 1

## 2017-04-03 MED ORDER — PIPERACILLIN-TAZOBACTAM 3.375 G IVPB 30 MIN
3.3750 g | Freq: Once | INTRAVENOUS | Status: AC
  Administered 2017-04-03: 3.375 g via INTRAVENOUS
  Filled 2017-04-03: qty 50

## 2017-04-03 MED ORDER — SODIUM CHLORIDE 0.9% FLUSH
3.0000 mL | Freq: Two times a day (BID) | INTRAVENOUS | Status: DC
  Administered 2017-04-03 – 2017-04-04 (×4): 3 mL via INTRAVENOUS

## 2017-04-03 MED ORDER — LORAZEPAM 2 MG/ML PO CONC: 1.0000 mg | ORAL | Status: DC | PRN

## 2017-04-03 MED ORDER — POLYVINYL ALCOHOL 1.4 % OP SOLN
1.0000 [drp] | Freq: Four times a day (QID) | OPHTHALMIC | Status: DC | PRN
  Filled 2017-04-03: qty 15

## 2017-04-03 MED ORDER — ROCURONIUM BROMIDE 50 MG/5ML IV SOLN
70.0000 mg | Freq: Once | INTRAVENOUS | Status: AC
  Administered 2017-04-03: 70 mg via INTRAVENOUS

## 2017-04-03 MED ORDER — VANCOMYCIN HCL IN DEXTROSE 1-5 GM/200ML-% IV SOLN
1000.0000 mg | Freq: Once | INTRAVENOUS | Status: AC
  Administered 2017-04-03: 1000 mg via INTRAVENOUS
  Filled 2017-04-03: qty 200

## 2017-04-03 MED ORDER — CHLORHEXIDINE GLUCONATE CLOTH 2 % EX PADS
6.0000 | MEDICATED_PAD | Freq: Every day | CUTANEOUS | Status: DC
  Administered 2017-04-04: 06:00:00 6 via TOPICAL

## 2017-04-03 MED ORDER — SODIUM CHLORIDE 0.9% FLUSH: 3.0000 mL | INTRAVENOUS | Status: DC | PRN

## 2017-04-03 MED ORDER — ETOMIDATE 2 MG/ML IV SOLN
30.0000 mg | Freq: Once | INTRAVENOUS | Status: AC
  Administered 2017-04-03: 30 mg via INTRAVENOUS

## 2017-04-03 MED ORDER — ACETAMINOPHEN 325 MG PO TABS: 650.0000 mg | ORAL_TABLET | Freq: Four times a day (QID) | ORAL | Status: DC | PRN

## 2017-04-03 MED ORDER — GLYCOPYRROLATE 0.2 MG/ML IJ SOLN: 0.2000 mg | INTRAMUSCULAR | Status: DC | PRN

## 2017-04-03 MED ORDER — GLYCOPYRROLATE 0.2 MG/ML IJ SOLN
0.2000 mg | INTRAMUSCULAR | Status: DC | PRN
  Filled 2017-04-03: qty 1

## 2017-04-03 MED ORDER — SODIUM CHLORIDE 0.9 % IV SOLN: 250.0000 mL | INTRAVENOUS | Status: DC | PRN

## 2017-04-03 MED ORDER — LORAZEPAM 2 MG/ML IJ SOLN
1.0000 mg | INTRAMUSCULAR | Status: DC | PRN
  Administered 2017-04-03 – 2017-04-04 (×5): 1 mg via INTRAVENOUS
  Filled 2017-04-03 (×5): qty 1

## 2017-04-03 MED ORDER — SODIUM CHLORIDE 0.9 % IV BOLUS (SEPSIS)
1000.0000 mL | Freq: Once | INTRAVENOUS | Status: AC
  Administered 2017-04-03: 1000 mL via INTRAVENOUS

## 2017-04-03 MED ORDER — HALOPERIDOL LACTATE 2 MG/ML PO CONC
0.5000 mg | ORAL | Status: DC | PRN
  Filled 2017-04-03: qty 0.3

## 2017-04-03 MED ORDER — DOCUSATE SODIUM 100 MG PO CAPS: 100.0000 mg | ORAL_CAPSULE | Freq: Two times a day (BID) | ORAL | Status: DC | PRN

## 2017-04-03 NOTE — H&P (Signed)
[place] at [place] NAME: Tanya Montgomery    MR#:  [id number]  DATE OF BIRTH:  1940/06/05  DATE OF ADMISSION:  03/13/2017  PRIMARY CARE PHYSICIAN: Shari [place], [facility]   REQUESTING/REFERRING PHYSICIAN: [place]  CHIEF COMPLAINT:   Chief Complaint  Patient presents with  . Code STEMI  . Respiratory Distress    HISTORY OF PRESENT ILLNESS: Tanya Montgomery  is a 76 y.o. female with a known history of CHF, COPD, DM, Stroke, Seizures, recent Pneumonia- admited to [facility] for 2 weeks and sent to [facility] 1 week ago. She was given a PEG tube for dysphagia. Was more alert and talking for 2-3 days at [facility]. As per daughter on Friday- at [facility] they gave regular diet tray to her ad she ate some- while she was suppose to have ONLY tube feeds. Next day- she looked lethargic, and cont to be like that for next 3-4 days , so today on their insist, EMS was called and she was sent to ER> Noted to be septic , hypoxic, hypotensive, not arousable. No DNR records from NH sent, and ER could not confirm- so intubated. He had seen some thick , pus like discharge on intubation in her airway. She cont to stay hypoxic inspite of ventilaor support and borderline hypotensive. Received Fluids and Abx in ER. Initially Daughter agreed with DNR, but to cont Abx and vent as now started, but later as pt cont to have hypoxia on vent- she decided to extubated her and keep her comfortable.   PAST MEDICAL HISTORY:   Past Medical History:  Diagnosis Date  . Asthma   . Breast cancer ([place]) 2000   . CHF (congestive heart failure) ([place])   . COPD (chronic obstructive pulmonary disease) ([place])   . Diabetes insipidus ([place])   . Diabetes mellitus without complication ([place])   . GERD (gastroesophageal reflux disease)   . Heart attack ([place])   . Heart murmur   . High cholesterol   . HTN (hypertension)   . Kidney disease   . Sleep apnea   . Stroke [facility])    TIA  . Thyroid nodule 2014     PAST SURGICAL HISTORY:  Past Surgical History:  Procedure Laterality Date  . APPENDECTOMY    . BREAST SURGERY Left 2001  . CAROTID STENT    . CHOLECYSTECTOMY     2004  . CORONARY ANGIOPLASTY  1998  . MASTECTOMY Left 2001  . NISSEN FUNDOPLICATION N/A 5852  . PARATHYROIDECTOMY Right 2002   Right inferior gland  . TONSILLECTOMY AND ADENOIDECTOMY    . TOTAL ABDOMINAL HYSTERECTOMY      SOCIAL HISTORY:  Social History   Tobacco Use  . Smoking status: Never Smoker  . Smokeless tobacco: Never Used  Substance Use Topics  . Alcohol use: No    Comment: Occasional drinker    FAMILY HISTORY:  Family History  Problem Relation Age of Onset  . Emphysema Father   . Lung cancer Father   . Cancer Father        Thyroid  . Heart disease Mother        died age 38  . Brain cancer Son        died age 83    DRUG ALLERGIES:  Allergies  Allergen Reactions  . Levaquin [Levofloxacin In D5w] Other (See Comments)    Reaction:  Tachycardia    REVIEW OF SYSTEMS:   Pt is drowsy, on vent support, intubated.  MEDICATIONS AT HOME:  Prior to Admission medications   Medication Sig Start Date End Date Taking? Authorizing Provider  albuterol (PROVENTIL) (2.5 MG/3ML) 0.083% nebulizer solution Take 2.5 mg by nebulization every 6 (six) hours as needed for wheezing or shortness of breath.   Yes [provider]  apixaban (ELIQUIS) 5 MG TABS tablet Take 1 tablet (5 mg total) by mouth 2 (two) times daily. Patient taking differently: Place 5 mg into feeding tube 2 (two) times daily.  12/30/14  Yes Bettey Costa, MD  atorvastatin (LIPITOR) 40 MG tablet Take 1 tablet (40 mg total) by mouth daily at 6 PM. Patient taking differently: Place 40 mg into feeding tube daily.  12/25/14  Yes Mody, Ulice Bold, MD  buPROPion (WELLBUTRIN SR) 100 MG 12 hr tablet Take 100 mg by mouth 3 (three) times daily.   Yes [provider]  carvedilol (COREG) 12.5 MG tablet Place 12.5 mg into feeding tube 2 (two)  times daily.   Yes [provider]  cetirizine (ZYRTEC) 10 MG tablet Place 10 mg into feeding tube daily.    Yes [provider]  digoxin (LANOXIN) 0.125 MG tablet Place into feeding tube daily. Monday,Tuesday,Thursday,Friday,Saturday   Yes [provider]  digoxin (LANOXIN) 0.25 MG tablet Place into feeding tube 2 (two) times a week. Sunday,Wednesday   Yes [provider]  donepezil (ARICEPT) 5 MG tablet Place 5 mg into feeding tube at bedtime.    Yes [provider]  DULoxetine (CYMBALTA) 20 MG capsule Take 20 mg by mouth daily.   Yes [provider]  famotidine (PEPCID) 40 MG/5ML suspension Place 8 mg into feeding tube daily.   Yes [provider]  hydrALAZINE (APRESOLINE) 50 MG tablet Take 25 mg by mouth 3 (three) times daily.    Yes [provider]  insulin aspart (NOVOLOG) 100 UNIT/ML injection Inject 0-10 Units into the skin 3 (three) times daily before meals.   Yes [provider]  Insulin Degludec (TRESIBA FLEXTOUCH) 100 UNIT/ML SOPN Inject 60 Units into the skin at bedtime.    Yes [provider]  isosorbide dinitrate (ISORDIL) 30 MG tablet Take 45 mg by mouth 2 (two) times daily.    Yes [provider]  LevETIRAcetam 750 MG TB3D Take 750 mg by mouth 2 (two) times daily.   Yes [provider]  magnesium hydroxide (MILK OF MAGNESIA) 400 MG/5ML suspension Place 30 mLs into feeding tube daily as needed for mild constipation.   Yes [provider]  Melatonin-Pyridoxine (MELATIN) 3-1 MG TABS Place into feeding tube at bedtime.   Yes [provider]  Multiple Vitamins-Minerals (MULTIVITAMIN WITH MINERALS) tablet Place 1 tablet into feeding tube daily.    Yes [provider]  omega-3 acid ethyl esters (LOVAZA) 1 G capsule Place 2 g into feeding tube 2 (two) times daily.    Yes [provider]  omeprazole (PRILOSEC) 40 MG capsule Take 40 mg by mouth daily.    Yes [provider]  primidone (MYSOLINE) 50 MG tablet Place 50 mg into feeding tube 2 (two) times daily.    Yes [provider]  saccharomyces boulardii (FLORASTOR) 250 MG capsule Place 250 mg into feeding tube 2 (two) times daily.   Yes [provider]  sertraline (ZOLOFT) 100 MG tablet Place 50 mg into feeding tube daily.    Yes [provider]  sitaGLIPtin-metformin (JANUMET) 50-1000 MG tablet Place 1 tablet into feeding tube daily.    Yes [provider]  torsemide (DEMADEX) 20 MG tablet Place 40 mg into feeding tube daily.    Yes [provider]  traZODone (DESYREL) 50 MG tablet Take 50 mg by mouth at bedtime.   Yes [provider]  valACYclovir (VALTREX) 1000 MG tablet Place into feeding tube daily.    Yes [provider]  valsartan (DIOVAN) 160 MG tablet Place 160 mg into feeding tube daily.    Yes [provider]      PHYSICAL EXAMINATION:   VITAL SIGNS: Blood pressure 130/69, pulse (!) 127, temperature 99.4 F (37.4 C), temperature source Axillary, resp. rate (!) 26, height 5\' 3"  (1.6 m), weight 61.7 kg (136 lb), SpO2 90 %.  GENERAL:  76 y.o.-year-old patient lying in the bed with acute distress.  EYES: Pupils equal, round, reactive to light and accommodation. No scleral icterus. Extraocular muscles intact.  HEENT: Head atraumatic, normocephalic. Oropharynx and nasopharynx clear.  NECK:  Supple, no jugular venous distention. No thyroid enlargement, no tenderness.  LUNGS: Normal breath sounds bilaterally, no wheezing,have crepitation. No use of accessory muscles of respiration. ETT in place on vent. CARDIOVASCULAR: S1, S2 normal. No murmurs, rubs, or gallops.  ABDOMEN: Soft, nontender, nondistended. Bowel sounds present. No organomegaly or mass. Foley with very turbid- pus like looking urine. EXTREMITIES: No pedal edema, cyanosis, or clubbing.  NEUROLOGIC: intubated on vent.  PSYCHIATRIC: The patient  is sedated due to meds for intubation.  SKIN: No obvious rash, lesion, or ulcer.   LABORATORY PANEL:   CBC Recent Labs  Lab 03/16/2017 0736  WBC 13.8*  HGB 12.2  HCT 37.5  PLT 253  MCV 95.3  MCH 31.1  MCHC 32.6  RDW 14.9*  LYMPHSABS 1.2  MONOABS 1.4*  EOSABS 0.0  BASOSABS 0.2*   ------------------------------------------------------------------------------------------------------------------  Chemistries  Recent Labs  Lab 03/19/2017 0736  NA 147*  K 4.4  CL 107  CO2 27  GLUCOSE 417*  BUN 35*  CREATININE 0.97  CALCIUM 9.3  AST 46*  ALT 53  ALKPHOS 118  BILITOT 0.7   ------------------------------------------------------------------------------------------------------------------ estimated creatinine clearance is 40.2 mL/min (by C-G formula based on SCr of 0.97 mg/dL). ------------------------------------------------------------------------------------------------------------------ No results for input(s): TSH, T4TOTAL, T3FREE, THYROIDAB in the last 72 hours.  Invalid input(s): FREET3   Coagulation profile Recent Labs  Lab 03/09/2017 0736  INR 1.13   ------------------------------------------------------------------------------------------------------------------- No results for input(s): DDIMER in the last 72 hours. -------------------------------------------------------------------------------------------------------------------  Cardiac Enzymes Recent Labs  Lab 03/13/2017 0736  TROPONINI 0.44*   ------------------------------------------------------------------------------------------------------------------ Invalid input(s): POCBNP  ---------------------------------------------------------------------------------------------------------------  Urinalysis    Component Value Date/Time   COLORURINE YELLOW (A) 03/17/2017 0800   APPEARANCEUR TURBID (A) 03/18/2017 0800   APPEARANCEUR Clear 01/19/2012 1415   LABSPEC 1.011 03/28/2017 0800   LABSPEC 1.004  01/19/2012 1415   PHURINE 5.0 03/31/2017 0800   GLUCOSEU >=500 (A) 03/16/2017 0800   GLUCOSEU Negative 01/19/2012 1415   HGBUR SMALL (A) 03/09/2017 0800   BILIRUBINUR NEGATIVE 04/02/2017 0800   BILIRUBINUR Negative 01/19/2012 1415   KETONESUR NEGATIVE 03/31/2017 0800   PROTEINUR 100 (A) 04/01/2017 0800   NITRITE NEGATIVE 03/28/2017 0800   LEUKOCYTESUR LARGE (A) 03/30/2017 0800   LEUKOCYTESUR Negative 01/19/2012 1415     RADIOLOGY: Dg Chest Port 1 View  Result Date: 04/04/2017 CLINICAL DATA:  Sepsis.  Intubation EXAM: PORTABLE CHEST 1 VIEW COMPARISON:  01/11/2015 FINDINGS: Endotracheal tube has been placed and is in good position. Progressive airspace disease with volume loss right upper lobe and right lower lobe. Left lung remains clear. Underlying COPD  Pseudarthrosis proximal left humerus unchanged. IMPRESSION: Endotracheal tube in good position Progressive airspace disease and volume loss right upper lobe and right lower lobe. Possible pneumonia. Electronically Signed   By: Franchot Gallo M.D.   On: 03/20/2017 08:15    EKG: Orders placed or performed during the hospital encounter of 03/15/2017  . ED EKG 12-Lead  . ED EKG 12-Lead  . EKG 12-Lead  . EKG 12-Lead    IMPRESSION AND PLAN:  * Septic shock   Aspiration pneumonia   UTI    Ac respi failure   Altered mental status    IV fluids and Abx given, Intubated.    Now daughter agreed on extubation and comfort care   Morphin as needed, Nasal canula oxygen.   Palliative care and chaplin consult.  * CHF, DM, COPD, HTn, Stroke, Dysphagia    Hold all meds as now on comfort care.  All the records are reviewed and case discussed with ED provider. Management plans discussed with the patient, family and they are in agreement.  CODE STATUS: DNR    Code Status Orders  (From admission, onward)        Start     Ordered   03/23/2017 1057  Do not attempt resuscitation (DNR)  Continuous    Question Answer Comment  In the event  of cardiac or respiratory ARREST Do not call a "code blue"   In the event of cardiac or respiratory ARREST Do not perform Intubation, CPR, defibrillation or ACLS   In the event of cardiac or respiratory ARREST Use medication by any route, position, wound care, and other measures to relive pain and suffering. May use oxygen, suction and manual treatment of airway obstruction as needed for comfort.   Comments confirmed with daughter.      04/04/2017 1056    Code Status History    Date Active Date Inactive Code Status Order ID Comments User Context   03/15/2017 10:24 03/13/2017 10:56 DNR [id number]  Asencion Gowda, NP ED   11/17/2016 21:11 11/19/2016 17:47 DNR [id number]  Fritzi Mandes, MD ED   01/11/2015 14:24 01/14/2015 19:06 DNR [id number]  Nicholes Mango, MD ED   12/24/2014 19:40 12/25/2014 19:08 DNR [id number]  Vaughan Basta, MD Inpatient    Advance Directive Documentation     Most Recent Value  Type of Advance Directive  Living will, Healthcare Power of Attorney  (Pended)   Pre-existing out of facility DNR order (yellow form or pink MOST form)  No data  "MOST" Form in Place?  No data       TOTAL TIME TAKING CARE OF THIS PATIENT: 65 minutes.    Vaughan Basta M.D on 03/14/2017   Between 7am to 6pm - Pager - [phone]  After 6pm go to [email or web address] - password [email or web address] [facility]  Office  [phone]  CC: Primary care physician; [facility]   Note: This dictation was prepared with Dragon dictation along with smaller phrase technology. Any transcriptional errors that result from this process are unintentional.

## 2017-04-03 NOTE — Consult Note (Addendum)
Consultation Note Date: 04/02/2017   Patient Name: Tanya Montgomery  DOB: 22-Aug-1940  MRN: [id number]  Age / Sex: 76 y.o., female  PCP: [facility] Referring Physician: Vaughan Montgomery, *  Reason for Consultation: Terminal Care and Withdrawal of life-sustaining treatment  HPI/Patient Profile: per the EMR notes, the patient arrive critically ill appearing with rhonchorous respirations and obvious respiratory distress saturating in the low 70s.  Per EMR, EMS was unsure if the patient had a DO NOT RESUSCITATE DO NOT INTUBATE order, and the patient was unable to answer, there were no family members. Decision was made to intubate for the patient's own safety and to prevent cardiovascular collapse and death.  During intubation suction of copious amounts of thick secretions and purulent material from between the cords concerning for aspiration pneumonia. She remained hypoxic on the ventilator. Her prehospital EKG  showed ST elevation and the Cath Lab was activated prehospital by EMS. Doctor spoke with family and the decision was made to forgo the cath lab. Patient has possible aspiration pneumonia, septic shock, respiratory failure, hyperglycemia, and altered mental status.   Patient was extubated and made comfort care. Palliative medicine was consulted.    Clinical Assessment and Goals of Care: Tanya Montgomery is resting in bed in the ED. She states a few words which are inaudible to me, and nothing else. She is on 6 lpm of O2 and has a productive cough.   We discussed diagnosis, prognosis, GOC, and EOL wishes. Family  confirms their wish to have comfort measures only for the patient, and are aware she has hours to days left. Family states everyone who will be present is currently at bedside.   Patient moved to 1C room. Comfort care orders placed using end of life order set.    Daughter is  Media planner.     SUMMARY OF RECOMMENDATIONS   Comfort care measures in place.   Code Status/Advance Care Planning:  DNR    Symptom Management:   Morphine for pain, dyspnea, air hunger  Ativan for anxiety  Haldol for agitation  Robinul for secretions  Palliative Prophylaxis:   Oral Care  Additional Recommendations (Limitations, Scope, Preferences):  Full Comfort Care  Psycho-social/Spiritual:   Desire for further Chaplaincy support:Chaplain has visited family  Prognosis:   Hours - Days Patient intubated in ED with low O2 sat. Patient made comfort care and extubated with low O2 sat.   Discharge Planning: Anticipated Hospital Death      Primary Diagnoses: Present on Admission: **None**   I have reviewed the medical record, interviewed the patient and family, and examined the patient. The following aspects are pertinent.  Past Medical History:  Diagnosis Date  . Asthma   . Breast cancer ([place]) 2000   . CHF (congestive heart failure) ([place])   . COPD (chronic obstructive pulmonary disease) ([place])   . Diabetes insipidus ([place])   . Diabetes mellitus without complication ([place])   . GERD (gastroesophageal reflux disease)   . Heart attack ([place])   . Heart  murmur   . High cholesterol   . HTN (hypertension)   . Kidney disease   . Sleep apnea   . Stroke [facility])    TIA  . Thyroid nodule 2014   Social History   Socioeconomic History  . Marital status: Widowed    Spouse name: None  . Number of children: None  . Years of education: None  . Highest education level: None  Social Needs  . Financial resource strain: None  . Food insecurity - worry: None  . Food insecurity - inability: None  . Transportation needs - medical: None  . Transportation needs - non-medical: None  Occupational History  . Occupation: retired  Tobacco Use  . Smoking status: Never Smoker  . Smokeless tobacco: Never Used  Substance and Sexual Activity  . Alcohol use: No    Comment:  Occasional drinker  . Drug use: No  . Sexual activity: None  Other Topics Concern  . None  Social History Narrative  . None   Family History  Problem Relation Age of Onset  . Emphysema Father   . Lung cancer Father   . Cancer Father        Thyroid  . Heart disease Mother        died age 57  . Brain cancer Son        died age 57   Scheduled Meds: . sodium chloride flush  3 mL Intravenous Q12H   Continuous Infusions: . sodium chloride     PRN Meds:.sodium chloride, acetaminophen **OR** acetaminophen, antiseptic oral rinse, [DISCONTINUED] glycopyrrolate **OR** [DISCONTINUED] glycopyrrolate **OR** glycopyrrolate, haloperidol **OR** haloperidol **OR** haloperidol lactate, LORazepam **OR** LORazepam **OR** LORazepam, morphine injection, ondansetron **OR** ondansetron (ZOFRAN) IV, polyvinyl alcohol, sodium chloride flush Medications Prior to Admission:  Prior to Admission medications   Medication Sig Start Date End Date Taking? Authorizing Provider  albuterol (PROVENTIL) (2.5 MG/3ML) 0.083% nebulizer solution Take 2.5 mg by nebulization every 6 (six) hours as needed for wheezing or shortness of breath.   Yes [provider]  apixaban (ELIQUIS) 5 MG TABS tablet Take 1 tablet (5 mg total) by mouth 2 (two) times daily. Patient taking differently: Place 5 mg into feeding tube 2 (two) times daily.  12/30/14  Yes Bettey Costa, MD  atorvastatin (LIPITOR) 40 MG tablet Take 1 tablet (40 mg total) by mouth daily at 6 PM. Patient taking differently: Place 40 mg into feeding tube daily.  12/25/14  Yes Mody, Ulice Bold, MD  buPROPion (WELLBUTRIN SR) 100 MG 12 hr tablet Take 100 mg by mouth 3 (three) times daily.   Yes [provider]  carvedilol (COREG) 12.5 MG tablet Place 12.5 mg into feeding tube 2 (two) times daily.   Yes [provider]  cetirizine (ZYRTEC) 10 MG tablet Place 10 mg into feeding tube daily.    Yes [provider]  digoxin (LANOXIN) 0.125 MG tablet  Place into feeding tube daily. Monday,Tuesday,Thursday,Friday,Saturday   Yes [provider]  digoxin (LANOXIN) 0.25 MG tablet Place into feeding tube 2 (two) times a week. Sunday,Wednesday   Yes [provider]  donepezil (ARICEPT) 5 MG tablet Place 5 mg into feeding tube at bedtime.    Yes [provider]  DULoxetine (CYMBALTA) 20 MG capsule Take 20 mg by mouth daily.   Yes [provider]  famotidine (PEPCID) 40 MG/5ML suspension Place 8 mg into feeding tube daily.   Yes [provider]  hydrALAZINE (APRESOLINE) 50 MG tablet Take 25 mg by mouth 3 (  three) times daily.    Yes [provider]  insulin aspart (NOVOLOG) 100 UNIT/ML injection Inject 0-10 Units into the skin 3 (three) times daily before meals.   Yes [provider]  Insulin Degludec (TRESIBA FLEXTOUCH) 100 UNIT/ML SOPN Inject 60 Units into the skin at bedtime.    Yes [provider]  isosorbide dinitrate (ISORDIL) 30 MG tablet Take 45 mg by mouth 2 (two) times daily.    Yes [provider]  LevETIRAcetam 750 MG TB3D Take 750 mg by mouth 2 (two) times daily.   Yes [provider]  magnesium hydroxide (MILK OF MAGNESIA) 400 MG/5ML suspension Place 30 mLs into feeding tube daily as needed for mild constipation.   Yes [provider]  Melatonin-Pyridoxine (MELATIN) 3-1 MG TABS Place into feeding tube at bedtime.   Yes [provider]  Multiple Vitamins-Minerals (MULTIVITAMIN WITH MINERALS) tablet Place 1 tablet into feeding tube daily.    Yes [provider]  omega-3 acid ethyl esters (LOVAZA) 1 G capsule Place 2 g into feeding tube 2 (two) times daily.    Yes [provider]  omeprazole (PRILOSEC) 40 MG capsule Take 40 mg by mouth daily.   Yes [provider]  primidone (MYSOLINE) 50 MG tablet Place 50 mg into feeding tube 2 (two) times daily.    Yes [provider]  saccharomyces boulardii  (FLORASTOR) 250 MG capsule Place 250 mg into feeding tube 2 (two) times daily.   Yes [provider]  sertraline (ZOLOFT) 100 MG tablet Place 50 mg into feeding tube daily.    Yes [provider]  sitaGLIPtin-metformin (JANUMET) 50-1000 MG tablet Place 1 tablet into feeding tube daily.    Yes [provider]  torsemide (DEMADEX) 20 MG tablet Place 40 mg into feeding tube daily.    Yes [provider]  traZODone (DESYREL) 50 MG tablet Take 50 mg by mouth at bedtime.   Yes [provider]  valACYclovir (VALTREX) 1000 MG tablet Place into feeding tube daily.    Yes [provider]  valsartan (DIOVAN) 160 MG tablet Place 160 mg into feeding tube daily.    Yes [provider]   Allergies  Allergen Reactions  . Levaquin [Levofloxacin In D5w] Other (See Comments)    Reaction:  Tachycardia   Review of Systems  Unable to perform ROS   Physical Exam  HENT:  Head: Normocephalic.  Pulmonary/Chest:  Productive cough.   Neurological:  Resting with eyes closed. Opened them to say something to family member which was inaudible to me.     Vital Signs: BP 117/68   Pulse (!) 107   Temp (!) 100.7 F (38.2 C)   Resp 14   Ht 5\' 3"  (1.6 m)   Wt 61.7 kg (136 lb)   SpO2 100%   BMI 24.09 kg/m  Pain Assessment: Faces       SpO2: SpO2: 100 % O2 Device:SpO2: 100 % O2 Flow Rate: .   IO: Intake/output summary:   Intake/Output Summary (Last 24 hours) at 03/15/2017 1051 Last data filed at 03/09/2017 0858 Gross per 24 hour  Intake 1250 ml  Output -  Net 1250 ml    LBM:   Baseline Weight: Weight: 61.7 kg (136 lb) Most recent weight: Weight: 61.7 kg (136 lb)     Palliative Assessment/Data: 10%     Time In: 10:00 Time Out: 11:10 Time Total: 70 Greater than 50%  of this time was spent counseling and coordinating  care related to the above assessment and plan.  Signed by: Asencion Gowda, NP 04/02/2017 11:10 AM Office:  [phone] 7am-7pm  Please see Amion for pager number and availability  Call primary team after hours   Please contact Palliative Medicine Team phone at [phone] for questions and concerns.  For individual provider: See Shea Evans

## 2017-04-03 NOTE — ED Notes (Signed)
Hospitalist to bedside at this time 

## 2017-04-03 NOTE — Progress Notes (Signed)
Per [facility] liaison patient was at [facility] for short term rehab. Per chart patient is now on comfort care. Clinical Social Worker (CSW) will continue to follow and assist as needed.   [employer], LCSW [phone]

## 2017-04-03 NOTE — ED Provider Notes (Addendum)
[facility] Emergency Department Provider Note  ____________________________________________   First MD Initiated Contact with Patient 03/29/2017 [phone]     (approximate)  I have reviewed the triage vital signs and the nursing notes.   HISTORY  Chief Complaint Code STEMI and Respiratory Distress  Level 5 exemption history limited by the patient's clinical condition  HPI Tanya Montgomery is a 75 y.o. female comes to the emergency department via EMS in respiratory distress from a nursing home.  History is limited but apparently the patient reported chest pain and increasing shortness of breath.  According to EMS the nursing home said the patient might be DO NOT RESUSCITATE DO NOT INTUBATE, however they were not sure if she did not have any paperwork and there was no family.  EMS noted ST elevation in V3 V4 prehospital and they activated the Cath Lab.  Past Medical History:  Diagnosis Date  . Asthma   . Breast cancer ([place]) 2000   . CHF (congestive heart failure) ([place])   . COPD (chronic obstructive pulmonary disease) ([place])   . Diabetes insipidus ([place])   . Diabetes mellitus without complication ([place])   . GERD (gastroesophageal reflux disease)   . Heart attack ([place])   . Heart murmur   . High cholesterol   . HTN (hypertension)   . Kidney disease   . Sleep apnea   . Stroke [facility])    TIA  . Thyroid nodule 2014    Patient Active Problem List   Diagnosis Date Noted  . Rectal bleed 11/17/2016  . Acute CVA (cerebrovascular accident) ([place]) 01/11/2015  . CVA (cerebral infarction) 12/24/2014  . Solitary pulmonary nodule 03/16/2013  . Breast cancer ([place])   . Thyroid nodule   . Diabetes mellitus without complication ([place])   . CHF (congestive heart failure) ([place])   . Asthma   . Hypertension 03/20/2012  . Chronic cough 12/15/2010    Past Surgical History:  Procedure Laterality Date  . APPENDECTOMY    . BREAST SURGERY Left 2001  . CAROTID STENT    .  CHOLECYSTECTOMY     2004  . CORONARY ANGIOPLASTY  1998  . MASTECTOMY Left 2001  . NISSEN FUNDOPLICATION N/A 2633  . PARATHYROIDECTOMY Right 2002   Right inferior gland  . TONSILLECTOMY AND ADENOIDECTOMY    . TOTAL ABDOMINAL HYSTERECTOMY      Prior to Admission medications   Medication Sig Start Date End Date Taking? Authorizing Provider  apixaban (ELIQUIS) 5 MG TABS tablet Take 1 tablet (5 mg total) by mouth 2 (two) times daily. 12/30/14   Bettey Costa, MD  atenolol (TENORMIN) 50 MG tablet Take 50 mg by mouth daily.    [provider]  atorvastatin (LIPITOR) 40 MG tablet Take 1 tablet (40 mg total) by mouth daily at 6 PM. Patient taking differently: Take 40 mg by mouth daily.  12/25/14   Bettey Costa, MD  BIOTIN PO Take 1 capsule by mouth daily.    [provider]  cetirizine (ZYRTEC) 10 MG tablet Take 10 mg by mouth daily.    [provider]  cholecalciferol (VITAMIN D) 1000 units tablet Take 1,000 Units by mouth daily.    [provider]  digoxin (LANOXIN) 0.25 MG tablet Take 0.125-0.25 mg by mouth daily. Pt takes 0.147m on Monday, Wednesday, and Friday.   Pt takes 0.276mon Tuesday, Thursday, Saturday, and Sunday.    [provider]  donepezil (ARICEPT) 5 MG tablet Take 5 mg by mouth at  bedtime.    [provider]  DULoxetine (CYMBALTA) 20 MG capsule Take 20 mg by mouth daily.    [provider]  glipiZIDE (GLUCOTROL XL) 10 MG 24 hr tablet Take 10 mg by mouth daily.     [provider]  hydrALAZINE (APRESOLINE) 25 MG tablet Take 25 mg by mouth 3 (three) times daily.     [provider]  Insulin Degludec (TRESIBA FLEXTOUCH) 100 UNIT/ML SOPN Inject 40 Units into the skin at bedtime.    [provider]  isosorbide dinitrate (ISORDIL) 30 MG tablet Take 45 mg by mouth 2 (two) times daily.     [provider]  Multiple Vitamins-Minerals (MULTIVITAMIN WITH MINERALS) tablet Take 1 tablet by mouth  daily.      [provider]  omega-3 acid ethyl esters (LOVAZA) 1 G capsule Take 2 g by mouth 2 (two) times daily.    [provider]  pantoprazole (PROTONIX) 40 MG tablet Take 40 mg by mouth daily.    [provider]  potassium chloride (KLOR-CON) 10 MEQ CR tablet Take 10 mEq by mouth daily. Pt takes on mon. Jeris Penta. fri.    [provider]  primidone (MYSOLINE) 50 MG tablet Take 50 mg by mouth 2 (two) times daily.    [provider]  sertraline (ZOLOFT) 50 MG tablet Take 50 mg by mouth daily.    [provider]  sitaGLIPtin-metformin (JANUMET) 50-1000 MG tablet Take 1 tablet by mouth daily.    [provider]  torsemide (DEMADEX) 20 MG tablet Take 40 mg by mouth daily.    [provider]  traMADol (ULTRAM) 50 MG tablet Take 50 mg by mouth daily as needed.    [provider]  valACYclovir (VALTREX) 1000 MG tablet Take 1,000 mg by mouth daily.    [provider]  valsartan (DIOVAN) 160 MG tablet Take 160 mg by mouth daily.      [provider]    Allergies Levaquin [levofloxacin in d5w]  Family History  Problem Relation Age of Onset  . Emphysema Father   . Lung cancer Father   . Cancer Father        Thyroid  . Heart disease Mother        died age 17  . Brain cancer Son        died age 64    Social History Social History   Tobacco Use  . Smoking status: Never Smoker  . Smokeless tobacco: Never Used  Substance Use Topics  . Alcohol use: No    Comment: Occasional drinker  . Drug use: No    Review of Systems Level 5 exemption history limited by the patient's clinical condition  ____________________________________________   PHYSICAL EXAM:  VITAL SIGNS: ED Triage Vitals  Enc Vitals Group     BP 03/13/2017 0734 121/87     Pulse --      Resp 03/14/2017 0734 (!) 26     Temp --      Temp src --      SpO2 03/29/2017 0728 (!) 77 %     Weight 03/08/2017 0739 136 lb (61.7 kg)     Height  03/14/2017 0739 5' 3"  (1.6 m)     Head Circumference --      [facility] Flow --      Pain Score --      Pain Loc --      Pain Edu? --      Excl. in  GC? --     Constitutional: Critically ill gasping for air sonorous breath sounds unable to speak more than 1 word Eyes: PERRL EOMI. Head: Atraumatic. Nose: No congestion/rhinnorhea. Mouth/Throat: No trismus Neck: No step-offs Cardiovascular: Tachycardic rate, regular rhythm. Grossly normal heart sounds.  Good peripheral circulation. Respiratory: Severe respiratory distress sonorous breath sounds coarse lung sounds throughout Gastrointestinal: Soft nontender Musculoskeletal: No lower extremity edema   Neurologic: Moves all 4 extremitie Skin:  . No rash noted.     ____________________________________________   DIFFERENTIAL includes but not limited to  Sepsis, STEMI, aspiration, pneumothorax, metabolic derangement ____________________________________________   LABS (all labs ordered are listed, but only abnormal results are displayed)  Labs Reviewed  LACTIC ACID, PLASMA - Abnormal; Notable for the following components:      Result Value   Lactic Acid, Venous 2.3 (*)    All other components within normal limits  COMPREHENSIVE METABOLIC PANEL - Abnormal; Notable for the following components:   Sodium 147 (*)    Glucose, Bld 417 (*)    BUN 35 (*)    Albumin 2.5 (*)    AST 46 (*)    GFR calc non Af Amer 55 (*)    All other components within normal limits  TROPONIN I - Abnormal; Notable for the following components:   Troponin I 0.44 (*)    All other components within normal limits  CBC WITH DIFFERENTIAL/PLATELET - Abnormal; Notable for the following components:   WBC 13.8 (*)    RDW 14.9 (*)    Neutro Abs 11.0 (*)    Monocytes Absolute 1.4 (*)    Basophils Absolute 0.2 (*)    All other components within normal limits  BRAIN NATRIURETIC PEPTIDE - Abnormal; Notable for the following components:   B Natriuretic Peptide 3,968.0 (*)     All other components within normal limits  CULTURE, BLOOD (ROUTINE X 2)  CULTURE, BLOOD (ROUTINE X 2)  URINE CULTURE  LIPASE, BLOOD  PROTIME-INR  LACTIC ACID, PLASMA  PROCALCITONIN  URINALYSIS, ROUTINE W REFLEX MICROSCOPIC  HEPARIN LEVEL (UNFRACTIONATED)  APTT    Lab work reviewed by me shows elevated white count which is nonspecific as well as elevated troponin which could be secondary to demand versus primary myocardial ischemia __________________________________________  EKG  ED ECG REPORT I, Darel Hong, the attending physician, personally viewed and interpreted this ECG.  Date: 03/12/2017 EKG Time:  Rate: 138 Rhythm: Sinus tachycardia QRS Axis: normal Intervals: normal ST/T Wave abnormalities: ST elevation in V3 and V4 concerning for acute myocardial ischemia Narrative Interpretation: Concerning for STEMI  ____________________________________________  RADIOLOGY  Chest x-ray reviewed by me concerning for pneumonia ____________________________________________   PROCEDURES  Procedure(s) performed: no  .Critical Care Performed by: Darel Hong, MD Authorized by: Darel Hong, MD   Critical care provider statement:    Critical care time was exclusive of:  Separately billable procedures and treating other patients   Critical care was necessary to treat or prevent imminent or life-threatening deterioration of the following conditions:  Respiratory failure and sepsis   Critical care was time spent personally by me on the following activities:  Pulse oximetry, re-evaluation of patient's condition, review of old charts, examination of patient, evaluation of patient's response to treatment, discussions with consultants, ordering and performing treatments and interventions, ordering and review of laboratory studies, ordering and review of radiographic studies, obtaining history from patient or surrogate and ventilator management Comments:     Total critical  care was 40 minutes Procedure Name: Intubation Date/Time:  03/24/2017 8:00 AM Performed by: Darel Hong, MD Pre-anesthesia Checklist: Patient identified, Emergency Drugs available, Suction available, Patient being monitored and Timeout performed Oxygen Delivery Method: Nasal cannula Preoxygenation: Pre-oxygenation with 100% oxygen Induction Type: IV induction and Rapid sequence Ventilation: Two handed mask ventilation required Laryngoscope Size: Mac and 3 Grade View: Grade II Tube size: 7.0 mm Number of attempts: 2 Placement Confirmation: ETT inserted through vocal cords under direct vision,  Positive ETCO2 and Breath sounds checked- equal and bilateral Secured at: 22 cm Tube secured with: ETT holder Comments: Initially intubated under direct visualization however went back to no color change even though her oxygen went up.  I remove the tube reevaluated and successfully intubated on the second pass with color change this time.       Critical Care performed: yes    Observation: no ____________________________________________   INITIAL IMPRESSION / ASSESSMENT AND PLAN / ED COURSE  Pertinent labs & imaging results that were available during my care of the patient were reviewed by me and considered in my medical decision making (see chart for details).  The patient arrives critically ill appearing with rhonchorous respirations and obvious respiratory distress saturating in the low 70s.  Per EMS report the patient may have had a DO NOT RESUSCITATE DO NOT INTUBATE order, however the patient is unable to tell me this, there are no family members who can provide collateral information, and the nursing home did not have an official DO NOT RESUSCITATE order.  Given the uncertainty and the time sensitive nature of her illness decision was made to intubate for the patient's own safety and to prevent cardiovascular collapse and death.  During intubation I suctioned copious amount of thick  secretions and purulent material from between the cords concerning for aspiration pneumonia.  Her EKG today shows ST elevation in V3 and V4 which is different from previous EKGs and is concerning for ST elevation myocardial infarction.  Cath Lab was activated prehospital by EMS and I concur.  Broad-spectrum antibiotics and fluids now for sepsis as well.     ----------------------------------------- 8:25 AM on 04/02/2017 -----------------------------------------  Dr. Towanda Malkin at bedside discussed with family and decision not to go to the Cath Lab.  We will treat her as severe sepsis now and admit to the ICU.  I discussed with the hospitalist who is graciously agreed to admit the patient to his service. ____________________________________________   FINAL CLINICAL IMPRESSION(S) / ED DIAGNOSES  Final diagnoses:  Healthcare-associated pneumonia  ST elevation myocardial infarction (STEMI), unspecified artery ([place])      NEW MEDICATIONS STARTED DURING THIS VISIT:  This SmartLink is deprecated. Use AVSMEDLIST instead to display the medication list for a patient.   Note:  This document was prepared using Dragon voice recognition software and may include unintentional dictation errors.     Darel Hong, MD 03/14/2017 1610    Darel Hong, MD 04/13/17 [phone]

## 2017-04-03 NOTE — Progress Notes (Signed)
   03/10/2017 0900  Clinical Encounter Type  Visited With Family;Patient and family together  Visit Type Initial;ED;Critical Care;Patient actively dying  Referral From Nurse  Spiritual Encounters  Spiritual Needs Emotional;Prayer;Ritual;Other (Comment) (Ritual request for [facility])  [place] responded to ED page to support patient family for terminal extubation; [place] offered spiritual and emotional support along with prayer; Family requested, if possible for [employer] to offer anointing of the sick; [facility] contacted [facility] who will send [place]. 9:47 AM Gwynn Burly

## 2017-04-03 NOTE — ED Triage Notes (Signed)
Pt in via [facility] from [facility], per EMS, call out for "sick x 3 days."  Pt with saturation in 70's, on NRB mask upon arrival, with saturation up to 88%.  Pt responsive to verbal stimuli.  EDP at bedside.

## 2017-04-03 NOTE — Progress Notes (Signed)
ER call me for admitting this patient. She is sent from nursing home with recent long hospital stay at [facility] and placement of feeding tube, found hypotensive and hypoxic and in absence of proper documentation from nursing home, intubated in ER and started on ventilator. During my visit her daughter and a very good friend were present in the room. I had a detailed discussion about the patient's diagnosis of possible aspiration pneumonia, septic shock, respiratory failure and altered mental status- and also discussed about the management plan, and treatment options. As patient is already intubated initially daughter suggested to continue antibiotics and watch her in ICU without any further escalation of care. She denied using vasopressors or CPR or cardioversion in adverse event. In a few min, in ER- she continued to stay hypoxic on ventilator in spite of doing suctioning by respiratory therapist her situation stayed in 89-90% on 70% of FiO2. Blood pressure is running soft in the 70L systolic. Daughter spoke to me again and now she decided to extubate the patient and just keep her comfortable as she knows and she feels that this is the time her mother is not going to survive and she is going to call all the related use. I clarified the goals of care with her and she agreed to involve hospice and comfort care and to give her morphine as needed for any agitations.  Time spent in discussing goals of care is 35 minutes.

## 2017-04-03 NOTE — Progress Notes (Signed)
One way extubation completed per family request, pt placed on home oxygen level of 6 L [place]

## 2017-04-03 NOTE — ED Notes (Signed)
Cardiology to bedside at this time.

## 2017-04-03 NOTE — Progress Notes (Signed)
ANTICOAGULATION CONSULT NOTE - Initial Consult  Pharmacy Consult for heparin Indication: chest pain/ACS  Allergies  Allergen Reactions  . Levaquin [Levofloxacin In D5w] Other (See Comments)    Reaction:  Tachycardia    Patient Measurements: Height: 5\' 3"  (160 cm) Weight: 136 lb (61.7 kg) IBW/kg (Calculated) : 52.4 Heparin Dosing Weight: 62 kg  Vital Signs: BP: 81/53 (11/28 0800) Pulse Rate: 122 (11/28 0800)  Labs: Recent Labs    03/25/2017 0736  HGB 12.2  HCT 37.5  PLT 253  APTT 30  LABPROT 14.4  INR 1.13  HEPARINUNFRC 0.43  CREATININE 0.97  TROPONINI 0.44*    Estimated Creatinine Clearance: 40.2 mL/min (by C-G formula based on SCr of 0.97 mg/dL).  Medical History: Past Medical History:  Diagnosis Date  . Asthma   . Breast cancer ([place]) 2000   . CHF (congestive heart failure) ([place])   . COPD (chronic obstructive pulmonary disease) ([place])   . Diabetes insipidus ([place])   . Diabetes mellitus without complication ([place])   . GERD (gastroesophageal reflux disease)   . Heart attack ([place])   . Heart murmur   . High cholesterol   . HTN (hypertension)   . Kidney disease   . Sleep apnea   . Stroke [facility])    TIA  . Thyroid nodule 2014    Assessment: Pharmacy consulted to dose and monitor heparin drip in this 76 year old female for ACS.  Patient reports taking apixaban PTA with reported last dose on 11/27 @ 2100 and is currently anticoagulated.  Baseline labs ordered including baseline HL and APTT.  Goal of Therapy:  Heparin level 0.3-0.7 units/ml Monitor platelets by anticoagulation protocol: Yes   Plan:  No bolus as patient is anticoagulated on apixaban Start heparin infusion at 750 units/hr now (12 hours post-apixaban dose) Check anti-Xa level in 8 hours and daily while on heparin Continue to monitor H&H and platelets  Lenis Noon, PharmD, BCPS Clinical Pharmacist 03/10/2017,8:34 AM

## 2017-04-03 NOTE — ED Notes (Addendum)
Pt on comfort care measures at this time per family request to hospitalist.  Family at bedside.

## 2017-04-04 LAB — BASIC METABOLIC PANEL
ANION GAP: 11 (ref 5–15)
BUN: 65 mg/dL — ABNORMAL HIGH (ref 6–20)
CHLORIDE: 113 mmol/L — AB (ref 101–111)
CO2: 27 mmol/L (ref 22–32)
CREATININE: 1.74 mg/dL — AB (ref 0.44–1.00)
Calcium: 8.5 mg/dL — ABNORMAL LOW (ref 8.9–10.3)
GFR calc Af Amer: 31 mL/min — ABNORMAL LOW (ref >60)
GFR calc non Af Amer: 27 mL/min — ABNORMAL LOW (ref >60)
GLUCOSE: 567 mg/dL — AB (ref 65–99)
Potassium: 4.3 mmol/L (ref 3.5–5.1)
Sodium: 151 mmol/L — ABNORMAL HIGH (ref 135–145)

## 2017-04-04 LAB — CBC
HEMATOCRIT: 35.3 % (ref 35.0–47.0)
HEMOGLOBIN: 11.3 g/dL — AB (ref 12.0–16.0)
MCH: 31.2 pg (ref 26.0–34.0)
MCHC: 31.9 g/dL — AB (ref 32.0–36.0)
MCV: 97.6 fL (ref 80.0–100.0)
Platelets: 205 10*3/uL (ref 150–440)
RBC: 3.62 MIL/uL — ABNORMAL LOW (ref 3.80–5.20)
RDW: 15.3 % — AB (ref 11.5–14.5)
WBC: 13.8 10*3/uL — ABNORMAL HIGH (ref 3.6–11.0)

## 2017-04-04 MED ORDER — SODIUM CHLORIDE 0.9 % IV SOLN
1.0000 mg/h | INTRAVENOUS | Status: DC
  Administered 2017-04-04: 2 mg/h via INTRAVENOUS
  Filled 2017-04-04: qty 10

## 2017-04-04 MED ORDER — SODIUM CHLORIDE 0.9 % IV SOLN
1.0000 mg/h | INTRAVENOUS | Status: DC
  Administered 2017-04-04: 03:00:00 1 mg/h via INTRAVENOUS
  Filled 2017-04-04: qty 10

## 2017-04-04 MED ORDER — SODIUM CHLORIDE 0.9 % IV SOLN
1.0000 mg/h | INTRAVENOUS | Status: AC
  Filled 2017-04-04: qty 10

## 2017-04-04 MED ORDER — SODIUM CHLORIDE 0.9 % IV SOLN
1.0000 mg/h | INTRAVENOUS | Status: DC
  Filled 2017-04-04: qty 10

## 2017-04-04 NOTE — Progress Notes (Signed)
Nutrition Brief Note  Patient identified to be seen for low Braden score. Chart reviewed. Patient now transitioning to comfort care.  No nutrition interventions warranted at this time. Please consult RD as needed.  Willey Blade, MS, [place], LDN Office: [phone] Pager: [phone] After Hours/Weekend Pager: [phone]

## 2017-04-04 NOTE — Progress Notes (Signed)
Patient presented with septic shock with acute respiratory failure  Aspiration pneumonia Urinary tract infection  She is on comfort care  Family at bedside  Continue comfort care orders.

## 2017-04-04 NOTE — Progress Notes (Signed)
Daily Progress Note   Patient Name: Tanya Montgomery       Date: 04/04/2017 DOB: 1941-02-09  Age: 76 y.o. MRN#: [id number] Attending Physician: Bettey Costa, MD Primary Care Physician: Langley Gauss Primary Care Admit Date: 03/29/2017  Reason for Consultation/Follow-up: Terminal Care  Subjective: Tanya Montgomery is resting in bed with daughter and son in law at bedside. Morphine infusion 1mg /hr initiated at 2:00am. Tanya Montgomery appears calm, no moaning, eye brow furrowing, or movement of extremities. She is tachypnic.   Length of Stay: 1  Current Medications: Scheduled Meds:  . Chlorhexidine Gluconate Cloth  6 each Topical Q0600  . mupirocin ointment  1 application Nasal BID  . sodium chloride flush  3 mL Intravenous Q12H    Continuous Infusions: . sodium chloride    . morphine 1 mg/hr (04/04/17 0235)    PRN Meds: sodium chloride, acetaminophen **OR** acetaminophen, antiseptic oral rinse, docusate sodium, [DISCONTINUED] glycopyrrolate **OR** [DISCONTINUED] glycopyrrolate **OR** glycopyrrolate, haloperidol **OR** haloperidol **OR** haloperidol lactate, LORazepam **OR** LORazepam **OR** LORazepam, ondansetron **OR** ondansetron (ZOFRAN) IV, polyvinyl alcohol, sodium chloride flush  Physical Exam  Constitutional:  Eyes closed. No movement of extremities.   Pulmonary/Chest: Effort normal.            Vital Signs: BP (!) 141/73 (BP Location: Left Arm)   Pulse (!) 131   Temp 99.5 F (37.5 C) (Oral)   Resp (!) 22   Ht 5\' 3"  (1.6 m)   Wt 61.7 kg (136 lb)   SpO2 91%   BMI 24.09 kg/m  SpO2: SpO2: 91 % O2 Device: O2 Device: Nasal Cannula O2 Flow Rate: O2 Flow Rate (L/min): 2 L/min  Intake/output summary:   Intake/Output Summary (Last 24 hours) at 04/04/2017 0843 Last data  filed at 04/04/2017 6237 Gross per 24 hour  Intake 1203.6 ml  Output -  Net 1203.6 ml   LBM:   Baseline Weight: Weight: 61.7 kg (136 lb) Most recent weight: Weight: 61.7 kg (136 lb)       Palliative Assessment/Data: 10%      Patient Active Problem List   Diagnosis Date Noted  . Acute on chronic respiratory failure with hypoxia ([place]) 04/02/2017  . Sepsis ([place]) 03/08/2017  . Aspiration pneumonia ([place]) 03/30/2017  . Acute lower UTI 03/13/2017  . Rectal bleed 11/17/2016  . Acute CVA (cerebrovascular  accident) ([place]) 01/11/2015  . CVA (cerebral infarction) 12/24/2014  . Solitary pulmonary nodule 03/16/2013  . Breast cancer ([place])   . Thyroid nodule   . Diabetes mellitus without complication ([place])   . CHF (congestive heart failure) ([place])   . Asthma   . Hypertension 03/20/2012  . Chronic cough 12/15/2010    Palliative Care Assessment & Plan   Patient Profile: Per the EMR notes, the patient arrive critically ill appearing with rhonchorous respirations and obvious respiratory distress saturating in the low 70s. Per EMR, EMS was unsure if the patient had a DO NOT RESUSCITATE DO NOT INTUBATE order, and the patient was unable to answer, there were no family members. Decision was made to intubate for the patient's own safety and to prevent cardiovascular collapse and death. During intubation suction of copious amounts of thick secretions and purulent material from between the cords concerning for aspiration pneumonia. She remained hypoxic on the ventilator.Her prehospital EKG  showed ST elevation and theCath Lab was activated prehospital by EMS. Doctor spoke with family and the decision was made to forgo the cath lab. Patient has possible aspiration pneumonia, septic shock,respiratory failure, hyperglycemia, and altered mental status.   Patient was extubated and made comfort care. Palliative medicine was consulted.   Assessment: Tanya Montgomery appears tachypnic and unresponsive.  Morphine infusion ordered overnight.   Recommendations/Plan:  Continue current care. Call or questions or concerns.   Goals of Care and Additional Recommendations:  Limitations on Scope of Treatment: Full Comfort Care  Code Status:    Code Status Orders  (From admission, onward)        Start     Ordered   04/02/2017 1057  Do not attempt resuscitation (DNR)  Continuous    Question Answer Comment  In the event of cardiac or respiratory ARREST Do not call a "code blue"   In the event of cardiac or respiratory ARREST Do not perform Intubation, CPR, defibrillation or ACLS   In the event of cardiac or respiratory ARREST Use medication by any route, position, wound care, and other measures to relive pain and suffering. May use oxygen, suction and manual treatment of airway obstruction as needed for comfort.   Comments confirmed with daughter.      03/16/2017 1056    Code Status History    Date Active Date Inactive Code Status Order ID Comments User Context   03/09/2017 10:24 03/17/2017 10:56 DNR [id number]  Asencion Gowda, NP ED   11/17/2016 21:11 11/19/2016 17:47 DNR [id number]  Fritzi Mandes, MD ED   01/11/2015 14:24 01/14/2015 19:06 DNR [id number]  Nicholes Mango, MD ED   12/24/2014 19:40 12/25/2014 19:08 DNR [id number]  Vaughan Basta, MD Inpatient    Advance Directive Documentation     Most Recent Value  Type of Advance Directive  Living will, Healthcare Power of Attorney  Pre-existing out of facility DNR order (yellow form or pink MOST form)  No data  "MOST" Form in Place?  No data       Prognosis:   Hours - Days  Discharge Planning:  Anticipated Hospital Death   Thank you for allowing the Palliative Medicine Team to assist in the care of this patient.   Total Time 25 minutes Prolonged Time Billed  No       Greater than 50%  of this time was spent counseling and coordinating care related to the above assessment and plan. Asencion Gowda, NP 04/04/2017 8:54  AM Office: [phone] 7am-7pm  Please see Amion for pager  number and availability  Call primary team after hours  Please contact Palliative Medicine Team phone at [phone] for questions and concerns.

## 2017-04-05 LAB — URINE CULTURE: Culture: >=100000 — AB

## 2017-04-06 NOTE — Significant Event (Signed)
April 14, 2017 1440. Contacted by Izora Ribas, M.E. Stated case was declined by Office of the Medical Examiner, for county or state M.E. Jurisdiction. Was instructed to have attending MD sign death certificate. Izora Ribas stated he would contact Knute Neu, daughter to explain why case was declined.

## 2017-04-06 NOTE — Discharge Summary (Signed)
DEATH SUMMARY   Patient Details  Name: Tanya Montgomery MRN: [id number] DOB: 1940-10-07  Admission/Discharge Information   Admit Date:  04/25/17  Date of Death: Date of Death: 04/27/17  Time of Death: Time of Death: 0105  Length of Stay: 2  Referring Physician: Langley Gauss Primary Care   Reason(s) for Hospitalization  resp distress   Diagnoses  Preliminary cause of death:  Secondary Diagnoses (including complications and co-morbidities):  Principal Problem:   Acute on chronic respiratory failure with hypoxia ([place]) Active Problems:   Sepsis ([place])   Aspiration pneumonia ([place])   Acute lower UTI   Brief Hospital Course (including significant findings, care, treatment, and services provided and events leading to death)  Raechal Raben  is a 76 y.o. female with a known history of CHF, COPD, DM, Stroke, Seizures, recent Pneumonia- admited to [facility] for 2 weeks and sent to [facility] 1 week ago. She was given a PEG tube for dysphagia. Was more alert and talking for 2-3 days at [facility]. As per daughter on 08-25-2022- at [facility] they gave regular diet tray to her ad she ate some- while she was suppose to have ONLY tube feeds. Next day- she looked lethargic, and cont to be like that for next 3-4 days , so today on their insist, EMS was called and she was sent to ER> Noted to be septic , hypoxic, hypotensive, not arousable. No DNR records from NH sent, and ER could not confirm- so intubated. He had seen some thick , pus like discharge on intubation in her airway. She cont to stay hypoxic inspite of ventilaor support and borderline hypotensive. Received Fluids and Abx in ER. Initially Daughter agreed with DNR, but to cont Abx and vent as now started, but later as pt cont to have hypoxia on vent- she decided to extubated her and keep her comfortable.       Pertinent Labs and Studies  Significant Diagnostic Studies Dg Chest Port 1 View  Result Date: 04-25-2017 CLINICAL DATA:  Sepsis.  Intubation  EXAM: PORTABLE CHEST 1 VIEW COMPARISON:  01/11/2015 FINDINGS: Endotracheal tube has been placed and is in good position. Progressive airspace disease with volume loss right upper lobe and right lower lobe. Left lung remains clear. Underlying COPD Pseudarthrosis proximal left humerus unchanged. IMPRESSION: Endotracheal tube in good position Progressive airspace disease and volume loss right upper lobe and right lower lobe. Possible pneumonia. Electronically Signed   By: Franchot Gallo M.D.   On: 04/25/17 08:15    Microbiology Recent Results (from the past 240 hour(s))  Blood Culture (routine x 2)     Status: None (Preliminary result)   Collection Time: 04/25/2017  7:36 AM  Result Value Ref Range Status   Specimen Description BLOOD Blood Culture adequate volume  Final   Special Requests   Final    BOTTLES DRAWN AEROBIC AND ANAEROBIC BLOOD LEFT FOREARM   Culture NO GROWTH 2 DAYS  Final   Report Status PENDING  Incomplete  Blood Culture (routine x 2)     Status: None (Preliminary result)   Collection Time: 2017/04/25  7:37 AM  Result Value Ref Range Status   Specimen Description   Final    BLOOD Blood Culture results may not be optimal due to an excessive volume of blood received in culture bottles   Special Requests   Final    BOTTLES DRAWN AEROBIC AND ANAEROBIC BLOOD RIGHT HAND   Culture NO GROWTH 2 DAYS  Final   Report Status PENDING  Incomplete  Urine culture     Status: Abnormal   Collection Time: 03/19/2017  8:00 AM  Result Value Ref Range Status   Specimen Description URINE, RANDOM  Final   Special Requests NONE  Final   Culture >=100,000 COLONIES/mL ENTEROCOCCUS FAECALIS (A)  Final   Report Status 04/14/2017 FINAL  Final   Organism ID, Bacteria ENTEROCOCCUS FAECALIS (A)  Final      Susceptibility   Enterococcus faecalis - MIC*    AMPICILLIN <=2 SENSITIVE Sensitive     LEVOFLOXACIN >=8 RESISTANT Resistant     NITROFURANTOIN <=16 SENSITIVE Sensitive     VANCOMYCIN 1 SENSITIVE  Sensitive     * >=100,000 COLONIES/mL ENTEROCOCCUS FAECALIS  MRSA PCR Screening     Status: Abnormal   Collection Time: 03/18/2017  5:40 PM  Result Value Ref Range Status   MRSA by PCR POSITIVE (A) NEGATIVE Final    Comment:        The GeneXpert MRSA Assay (FDA approved for NASAL specimens only), is one component of a comprehensive MRSA colonization surveillance program. It is not intended to diagnose MRSA infection nor to guide or monitor treatment for MRSA infections. RESULT CALLED TO, READ BACK BY AND VERIFIED WITH: JACKIE PAGE 03/20/2017 @ 2017  [place]     Lab Basic Metabolic Panel: Recent Labs  Lab 04/04/2017 0736 04/04/17 0714  NA 147* 151*  K 4.4 4.3  CL 107 113*  CO2 27 27  GLUCOSE 417* 567*  BUN 35* 65*  CREATININE 0.97 1.74*  CALCIUM 9.3 8.5*   Liver Function Tests: Recent Labs  Lab 03/31/2017 0736  AST 46*  ALT 53  ALKPHOS 118  BILITOT 0.7  PROT 7.2  ALBUMIN 2.5*   Recent Labs  Lab 03/26/2017 0736  LIPASE 37   No results for input(s): AMMONIA in the last 168 hours. CBC: Recent Labs  Lab 03/26/2017 0736 04/04/17 0714  WBC 13.8* 13.8*  NEUTROABS 11.0*  --   HGB 12.2 11.3*  HCT 37.5 35.3  MCV 95.3 97.6  PLT 253 205   Cardiac Enzymes: Recent Labs  Lab 03/12/2017 0736  TROPONINI 0.44*   Sepsis Labs: Recent Labs  Lab 03/19/2017 0736 04/04/17 0714  PROCALCITON 0.25  --   WBC 13.8* 13.8*  LATICACIDVEN 2.3*  --     Procedures/Operations  none   Javed Cotto 04-14-17, 9:52 AM

## 2017-04-06 NOTE — Plan of Care (Signed)
Pt expired at [facility]. Nursing supervisor and Dr. Marcille Blanco is aware. IVs and foley removed. Daughter and family at bedside. Morphine wasted with Caren Griffins. Death was pronounced by this nurse and Aris Georgia. Family refused Chaplain to come to room. Pt is a possible tissue donor. No other help needed by family at this time.

## 2017-04-06 NOTE — Progress Notes (Signed)
04-19-2017 1900 Spoke with Mrs. Tanya Montgomery about process of arranging private autopsy. Sent her an email with a list of multiple providers of private autopsy, the names of several transport services. Asked her is she had concerns about any of the care provided to her mother at [facility]. She stated "No, everybody there was wonderful and I don't have any complaints about her care".  She had questions about specific findings in the chart. I told her to contact Medical Records for information about obtaining records.

## 2017-04-06 NOTE — Progress Notes (Signed)
04/27/17. Tanya Montgomery had been notified of patient death @0644 . Around 0830, they called stating they would defer pick up of deceased until they met with family on 04/06/2017 to finalize plans.

## 2017-04-06 NOTE — Significant Event (Signed)
2017-04-07 1130. Contacted by Dorian Pod Nash([phone])patient's daughter. She stated she believed her mother's death was an unnatural occurrence and would like an autopsy. She states her mother was a resident at [facility] in [place]. Her mother was there for inpatient swallowing rehabilitation. Her mother had a PEG tube and was NPO. On 03/29/17, her mother was served a dinner tray, eating some of the meal which she retained in her mouth. She was notified of the event by [facility] around 2300. She states she was told the food had been cleared from her mother's mouth once the mistake was discovered and she had not swallowed any of the food. After this incident, Knute Neu states she noticed a decline in her mother's level of consciousness and general well being. She states she  informed the staff of the change, no changes in care were made.  On 04/02/2017 patient was brought to ED. Dorian Pod states she was told by "either EMS or someone at the hospital' , "when they ventilated her there were unnatural particles in her airway". She states she was told her mother had aspiration pneumonia and Knute Neu was concerned it resulted from being fed solid food while at [facility] and delay in seeking treatment for her declining condition.  She states she contacted the [employer], filing a complaint. Case number [id number] and the matter would be referred for investigation. Contacted Izora Ribas M.E of daughter's concerns. He states he is requesting records.

## 2017-04-06 DEATH — deceased

## 2017-04-08 LAB — CULTURE, BLOOD (ROUTINE X 2)
Culture: NO GROWTH
Culture: NO GROWTH
SPECIMEN DESCRIPTION: ADEQUATE

## 2017-04-19 NOTE — Consult Note (Signed)
Reason for Consult: Code STEMI Referring Physician: [facility] primary care medicine, Dr. Benjie Karvonen hospitalist  Tanya Montgomery is an 76 y.o. female.  HPI: Known congestive heart failure COPD diabetes previous stroke seizure disorder recent pneumonia admitted at [facility] for 2 weeks [place] for skilled nursing about a week ago.  Patient was given a PEG for dysphagia.  Patient was alert to 3 days ago at skilled nursing seen by her daughter on Friday at the nursing home for some reason the patient was given a regular diet tray.  Patient seems to have some frank aspiration and developed a history failure and then had to be brought to the emergency room because of altered mental status possible sepsis.  EMS was called because the patient continued was found to be abnormal call us code STEMI.  After evaluation of EKG and evaluate the patient I determined that transferring to the Cath Lab for PCI and stent was not appropriate.  Patient had a similar presentation at [facility] recently.  After discussing the case with the emergency room physician and daughter we decided that PCI and stent and Cath Lab procedure was not the appropriate procedure and we decided to treat the patient conservatively and medically.  A code STEMI was called off  Past Medical History:  Diagnosis Date  . Asthma   . Breast cancer ([place]) 2000   . CHF (congestive heart failure) ([place])   . COPD (chronic obstructive pulmonary disease) ([place])   . Diabetes insipidus ([place])   . Diabetes mellitus without complication ([place])   . GERD (gastroesophageal reflux disease)   . Heart attack ([place])   . Heart murmur   . High cholesterol   . HTN (hypertension)   . Kidney disease   . Sleep apnea   . Stroke [facility])    TIA  . Thyroid nodule 2014    Past Surgical History:  Procedure Laterality Date  . APPENDECTOMY    . BREAST SURGERY Left 2001  . CAROTID STENT    . CHOLECYSTECTOMY     2004  . CORONARY ANGIOPLASTY  1998  . MASTECTOMY Left 2001  . NISSEN  FUNDOPLICATION N/A 0623  . PARATHYROIDECTOMY Right 2002   Right inferior gland  . TONSILLECTOMY AND ADENOIDECTOMY    . TOTAL ABDOMINAL HYSTERECTOMY      Family History  Problem Relation Age of Onset  . Emphysema Father   . Lung cancer Father   . Cancer Father        Thyroid  . Heart disease Mother        died age 62  . Brain cancer Son        died age 80    Social History:  reports that  has never smoked. she has never used smokeless tobacco. She reports that she does not drink alcohol or use drugs.  Allergies:  Allergies  Allergen Reactions  . Levaquin [Levofloxacin In D5w] Other (See Comments)    Reaction:  Tachycardia    Medications: I have reviewed the patient's current medications.  No results found for this or any previous visit (from the past 48 hour(s)).  No results found.  Review of Systems  Unable to perform ROS: Intubated  Constitutional: Positive for diaphoresis and malaise/fatigue.  HENT: Positive for congestion.   Eyes: Negative.   Respiratory: Positive for cough and shortness of breath.   Cardiovascular: Positive for palpitations, orthopnea, leg swelling and PND.  Gastrointestinal: Positive for heartburn.  Genitourinary: Negative.   Musculoskeletal: Positive for myalgias.  Skin:  Negative.   Neurological: Positive for weakness.  Endo/Heme/Allergies: Negative.   Psychiatric/Behavioral: Negative.    Blood pressure (!) 141/73, pulse (!) 131, temperature 99.5 F (37.5 C), temperature source Oral, resp. rate (!) 22, height 5\' 3"  (1.6 m), weight 136 lb (61.7 kg), SpO2 91 %. Physical Exam  Nursing note and vitals reviewed. Constitutional: She appears well-developed and well-nourished. She appears lethargic.  HENT:  Head: Normocephalic and atraumatic.  Eyes: Conjunctivae and EOM are normal. Pupils are equal, round, and reactive to light.  Neck: Normal range of motion. Neck supple.  Cardiovascular: Normal rate and regular rhythm. Exam reveals gallop.   Murmur heard. Respiratory: Breath sounds normal. She is in respiratory distress.  GI: Soft. Bowel sounds are normal.  Musculoskeletal: Normal range of motion. She exhibits edema.  Neurological: She has normal strength and normal reflexes. She appears lethargic. She displays a negative Romberg sign.    Assessment/Plan: Possible STEMI Abnormal EKG Respiratory failure Aspiration pneumonia Sepsis COPD Congestive heart failure Diabetes CVA History seizures History of pneumonia . Plan Abnormal EKG possible STEMI but do not recommend acute invasive course Continue ventilatory support Broad-spectrum antibiotic therapy for possible aspiration Agree with inhaler therapy Recommend fluid resuscitation I do not recommend pressor therapy Consider comfort measures Before cardiac cath or coronary intervention  Dwayne D Callwood 04/19/2017, 10:58 AM
